# Patient Record
Sex: Female | Born: 1965
Health system: Southern US, Community
[De-identification: ages and names within clinical notes are randomized; demographics above are authoritative.]

## PROBLEM LIST (undated history)

## (undated) DIAGNOSIS — N200 Calculus of kidney: Secondary | ICD-10-CM

## (undated) DIAGNOSIS — A609 Anogenital herpesviral infection, unspecified: Secondary | ICD-10-CM

## (undated) DIAGNOSIS — A64 Unspecified sexually transmitted disease: Secondary | ICD-10-CM

## (undated) DIAGNOSIS — K589 Irritable bowel syndrome without diarrhea: Secondary | ICD-10-CM

## (undated) DIAGNOSIS — F32A Depression, unspecified: Secondary | ICD-10-CM

## (undated) DIAGNOSIS — I10 Essential (primary) hypertension: Secondary | ICD-10-CM

## (undated) DIAGNOSIS — R931 Abnormal findings on diagnostic imaging of heart and coronary circulation: Secondary | ICD-10-CM

## (undated) DIAGNOSIS — M26609 Unspecified temporomandibular joint disorder, unspecified side: Secondary | ICD-10-CM

## (undated) DIAGNOSIS — F329 Major depressive disorder, single episode, unspecified: Secondary | ICD-10-CM

## (undated) DIAGNOSIS — H409 Unspecified glaucoma: Secondary | ICD-10-CM

## (undated) DIAGNOSIS — N3010 Interstitial cystitis (chronic) without hematuria: Secondary | ICD-10-CM

## (undated) DIAGNOSIS — K219 Gastro-esophageal reflux disease without esophagitis: Secondary | ICD-10-CM

## (undated) DIAGNOSIS — S46009A Unspecified injury of muscle(s) and tendon(s) of the rotator cuff of unspecified shoulder, initial encounter: Secondary | ICD-10-CM

## (undated) DIAGNOSIS — IMO0002 Reserved for concepts with insufficient information to code with codable children: Secondary | ICD-10-CM

## (undated) DIAGNOSIS — Z8669 Personal history of other diseases of the nervous system and sense organs: Secondary | ICD-10-CM

## (undated) DIAGNOSIS — C44729 Squamous cell carcinoma of skin of left lower limb, including hip: Secondary | ICD-10-CM

## (undated) HISTORY — DX: Personal history of other diseases of the nervous system and sense organs: Z86.69

## (undated) HISTORY — DX: Anogenital herpesviral infection, unspecified: A60.9

## (undated) HISTORY — DX: Essential (primary) hypertension: I10

## (undated) HISTORY — DX: Unspecified sexually transmitted disease: A64

## (undated) HISTORY — DX: Irritable bowel syndrome, unspecified: K58.9

## (undated) HISTORY — DX: Unspecified injury of muscle(s) and tendon(s) of the rotator cuff of unspecified shoulder, initial encounter: S46.009A

## (undated) HISTORY — DX: Depression, unspecified: F32.A

## (undated) HISTORY — PX: SQUAMOUS CELL CARCINOMA EXCISION: SHX2433

## (undated) HISTORY — DX: Interstitial cystitis (chronic) without hematuria: N30.10

## (undated) HISTORY — DX: Gastro-esophageal reflux disease without esophagitis: K21.9

## (undated) HISTORY — DX: Calculus of kidney: N20.0

## (undated) HISTORY — DX: Squamous cell carcinoma of skin of left lower limb, including hip: C44.729

## (undated) HISTORY — PX: LITHOTRIPSY: SUR834

## (undated) HISTORY — DX: Unspecified glaucoma: H40.9

## (undated) HISTORY — DX: Abnormal findings on diagnostic imaging of heart and coronary circulation: R93.1

## (undated) HISTORY — DX: Reserved for concepts with insufficient information to code with codable children: IMO0002

## (undated) HISTORY — DX: Unspecified temporomandibular joint disorder, unspecified side: M26.609

## (undated) HISTORY — DX: Major depressive disorder, single episode, unspecified: F32.9

---
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
## 1996-02-16 HISTORY — PX: KNEE ARTHROPLASTY: SHX992

## 1999-10-17 DIAGNOSIS — Z8669 Personal history of other diseases of the nervous system and sense organs: Secondary | ICD-10-CM

## 1999-10-17 DIAGNOSIS — IMO0002 Reserved for concepts with insufficient information to code with codable children: Secondary | ICD-10-CM

## 1999-10-17 DIAGNOSIS — R87619 Unspecified abnormal cytological findings in specimens from cervix uteri: Secondary | ICD-10-CM

## 1999-10-17 HISTORY — DX: Reserved for concepts with insufficient information to code with codable children: IMO0002

## 1999-10-17 HISTORY — DX: Unspecified abnormal cytological findings in specimens from cervix uteri: R87.619

## 1999-10-17 HISTORY — DX: Personal history of other diseases of the nervous system and sense organs: Z86.69

## 1999-11-16 HISTORY — PX: COLPOSCOPY: SHX161

## 1999-11-23 ENCOUNTER — Other Ambulatory Visit: Admission: RE | Admit: 1999-11-23 | Discharge: 1999-11-23 | Payer: Self-pay | Admitting: Obstetrics and Gynecology

## 1999-11-23 ENCOUNTER — Encounter (INDEPENDENT_AMBULATORY_CARE_PROVIDER_SITE_OTHER): Payer: Self-pay

## 2000-03-21 ENCOUNTER — Other Ambulatory Visit: Admission: RE | Admit: 2000-03-21 | Discharge: 2000-03-21 | Payer: Self-pay | Admitting: Obstetrics and Gynecology

## 2000-04-11 ENCOUNTER — Other Ambulatory Visit: Admission: RE | Admit: 2000-04-11 | Discharge: 2000-04-11 | Payer: Self-pay | Admitting: Obstetrics and Gynecology

## 2000-06-28 LAB — HM PAP SMEAR

## 2000-07-14 ENCOUNTER — Other Ambulatory Visit: Admission: RE | Admit: 2000-07-14 | Discharge: 2000-07-14 | Payer: Self-pay | Admitting: Obstetrics and Gynecology

## 2000-10-20 ENCOUNTER — Other Ambulatory Visit: Admission: RE | Admit: 2000-10-20 | Discharge: 2000-10-20 | Payer: Self-pay | Admitting: Obstetrics and Gynecology

## 2001-01-17 ENCOUNTER — Other Ambulatory Visit: Admission: RE | Admit: 2001-01-17 | Discharge: 2001-01-17 | Payer: Self-pay | Admitting: Obstetrics and Gynecology

## 2001-05-04 ENCOUNTER — Other Ambulatory Visit: Admission: RE | Admit: 2001-05-04 | Discharge: 2001-05-04 | Payer: Self-pay | Admitting: Obstetrics and Gynecology

## 2001-11-09 ENCOUNTER — Other Ambulatory Visit: Admission: RE | Admit: 2001-11-09 | Discharge: 2001-11-09 | Payer: Self-pay | Admitting: Obstetrics and Gynecology

## 2002-02-15 DIAGNOSIS — M26609 Unspecified temporomandibular joint disorder, unspecified side: Secondary | ICD-10-CM

## 2002-02-15 HISTORY — DX: Unspecified temporomandibular joint disorder, unspecified side: M26.609

## 2002-04-16 DIAGNOSIS — IMO0002 Reserved for concepts with insufficient information to code with codable children: Secondary | ICD-10-CM

## 2002-04-16 HISTORY — DX: Reserved for concepts with insufficient information to code with codable children: IMO0002

## 2002-05-11 ENCOUNTER — Other Ambulatory Visit: Admission: RE | Admit: 2002-05-11 | Discharge: 2002-05-11 | Payer: Self-pay | Admitting: Obstetrics and Gynecology

## 2002-11-13 ENCOUNTER — Other Ambulatory Visit: Admission: RE | Admit: 2002-11-13 | Discharge: 2002-11-13 | Payer: Self-pay | Admitting: Obstetrics and Gynecology

## 2002-11-16 DIAGNOSIS — N3010 Interstitial cystitis (chronic) without hematuria: Secondary | ICD-10-CM

## 2002-11-16 DIAGNOSIS — K219 Gastro-esophageal reflux disease without esophagitis: Secondary | ICD-10-CM

## 2002-11-16 HISTORY — DX: Interstitial cystitis (chronic) without hematuria: N30.10

## 2002-11-16 HISTORY — DX: Gastro-esophageal reflux disease without esophagitis: K21.9

## 2003-07-25 ENCOUNTER — Other Ambulatory Visit: Admission: RE | Admit: 2003-07-25 | Discharge: 2003-07-25 | Payer: Self-pay | Admitting: Obstetrics and Gynecology

## 2004-02-13 ENCOUNTER — Other Ambulatory Visit: Admission: RE | Admit: 2004-02-13 | Discharge: 2004-02-13 | Payer: Self-pay | Admitting: Obstetrics and Gynecology

## 2004-07-27 ENCOUNTER — Ambulatory Visit: Payer: Self-pay | Admitting: Internal Medicine

## 2004-07-30 ENCOUNTER — Ambulatory Visit: Payer: Self-pay | Admitting: Internal Medicine

## 2004-08-19 ENCOUNTER — Encounter (INDEPENDENT_AMBULATORY_CARE_PROVIDER_SITE_OTHER): Payer: Self-pay | Admitting: *Deleted

## 2004-08-19 ENCOUNTER — Ambulatory Visit: Payer: Self-pay | Admitting: Internal Medicine

## 2005-02-01 ENCOUNTER — Other Ambulatory Visit: Admission: RE | Admit: 2005-02-01 | Discharge: 2005-02-01 | Payer: Self-pay | Admitting: Obstetrics and Gynecology

## 2005-02-23 ENCOUNTER — Other Ambulatory Visit: Admission: RE | Admit: 2005-02-23 | Discharge: 2005-02-23 | Payer: Self-pay | Admitting: Obstetrics and Gynecology

## 2006-02-15 HISTORY — PX: AUGMENTATION MAMMAPLASTY: SUR837

## 2006-03-25 ENCOUNTER — Other Ambulatory Visit: Admission: RE | Admit: 2006-03-25 | Discharge: 2006-03-25 | Payer: Self-pay | Admitting: Obstetrics & Gynecology

## 2007-04-11 ENCOUNTER — Other Ambulatory Visit: Admission: RE | Admit: 2007-04-11 | Discharge: 2007-04-11 | Payer: Self-pay | Admitting: Obstetrics and Gynecology

## 2008-04-12 ENCOUNTER — Other Ambulatory Visit: Admission: RE | Admit: 2008-04-12 | Discharge: 2008-04-12 | Payer: Self-pay | Admitting: Obstetrics and Gynecology

## 2008-05-29 IMAGING — CT CT HEAD W/O CM - CT MAXILLOFACIAL W/O CM
1 series · 14 of 16 positions shown, 18 images · non-contrast
Comparison: NONE

CLINICAL DATA: Four sinus infections within the last six months. 

CT OF THE PARANASAL SINUSES
TECHNIQUE: Multiple axial images were obtained.

[Series 2: — · axial · 0.29mm/px · z∈[+222,+315]mm · 14 of 16 slices shown, 18 images]
[im 2/16  brain]
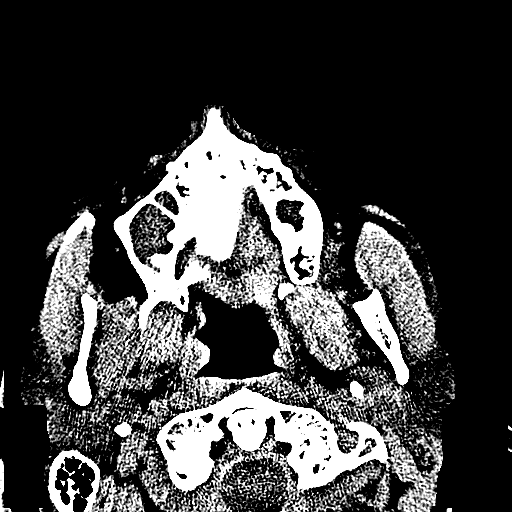
[im 2/16  bone]
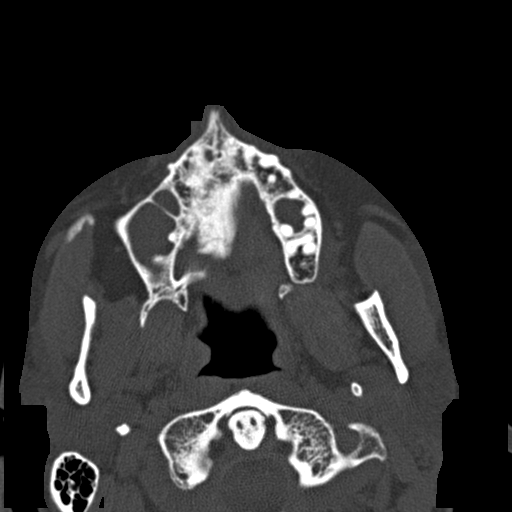
[im 3/16  bone]
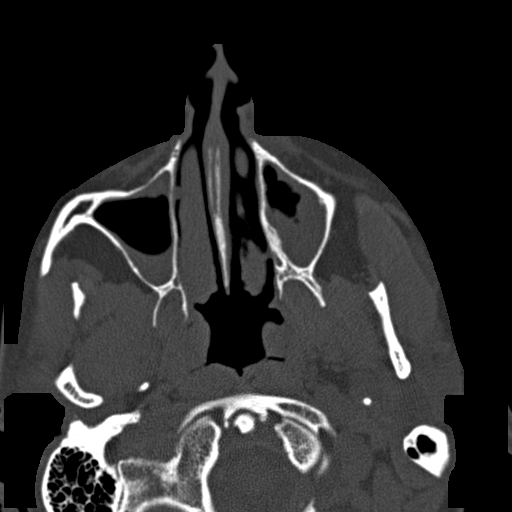
[im 4/16  bone]
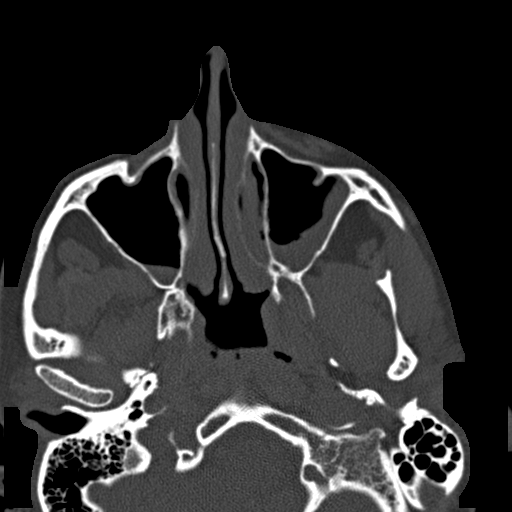
[im 5/16  bone]
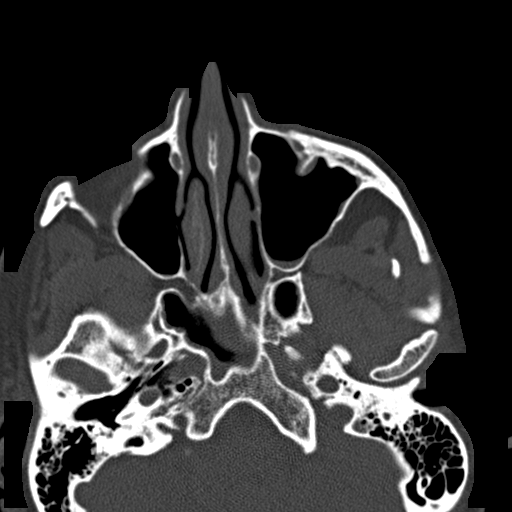
[im 6/16  brain]
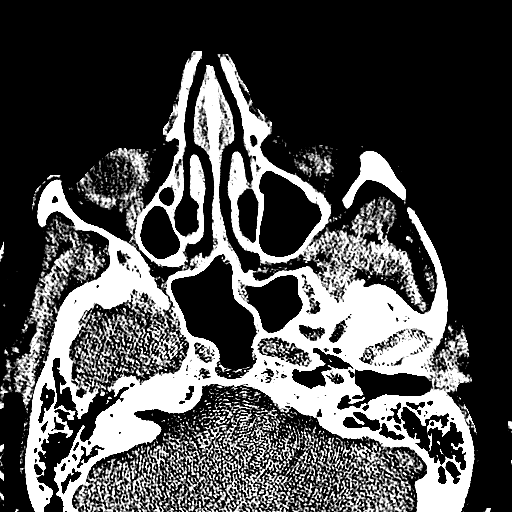
[im 6/16  bone]
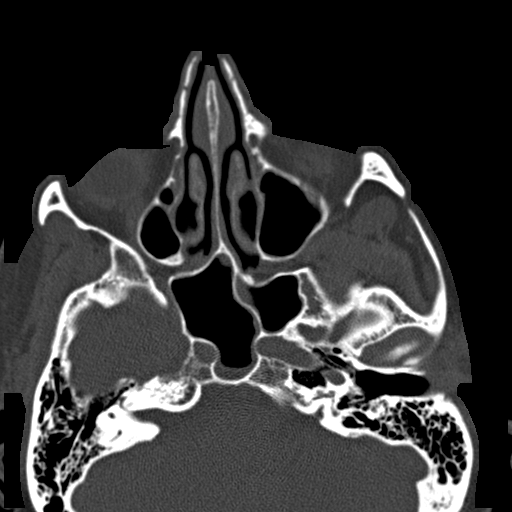
[im 7/16  bone]
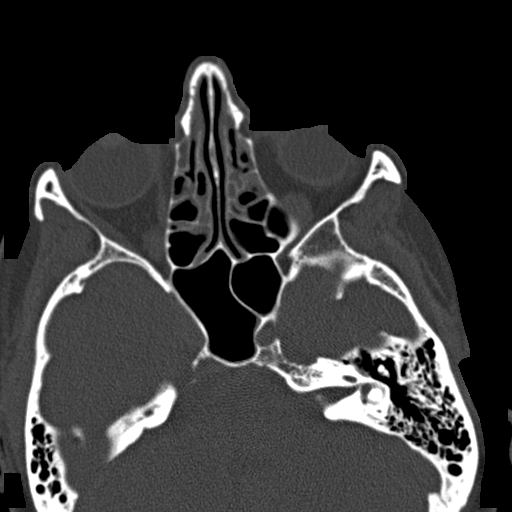
[im 8/16  bone]
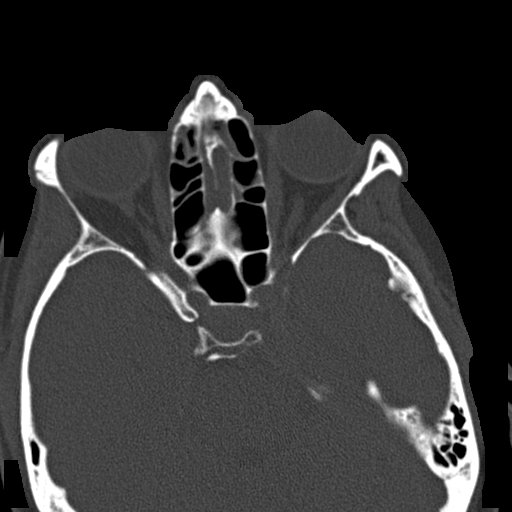
[im 9/16  bone]
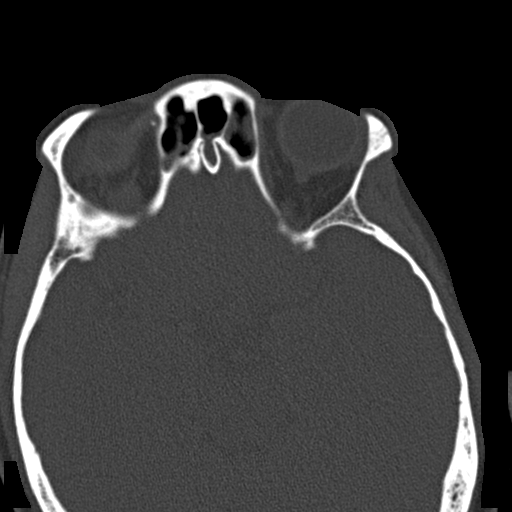
[im 10/16  brain]
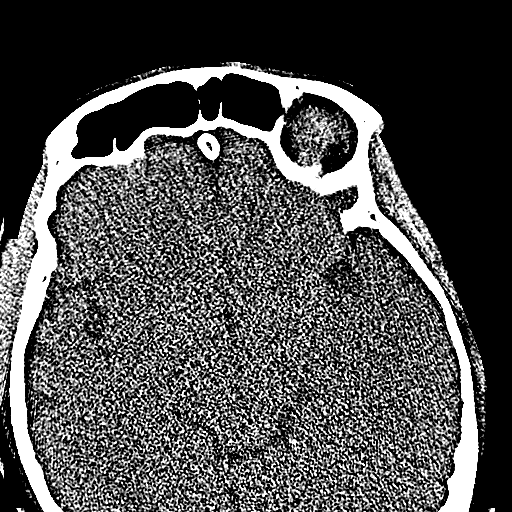
[im 10/16  bone]
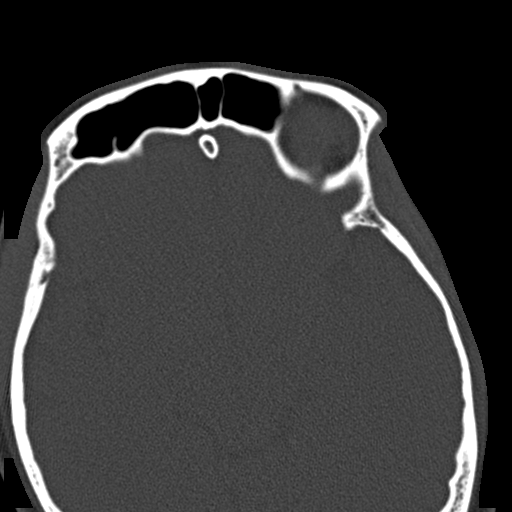
[im 11/16  bone]
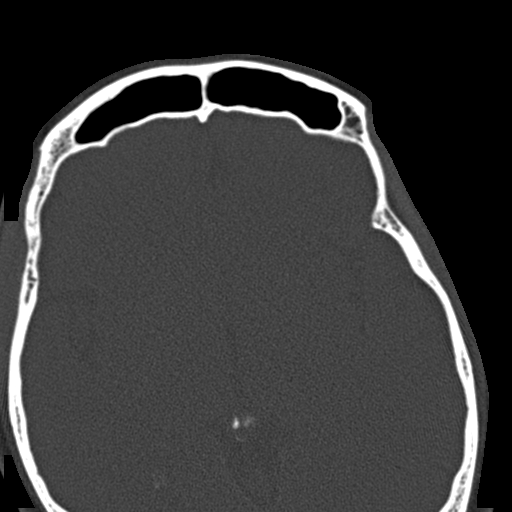
[im 12/16  bone]
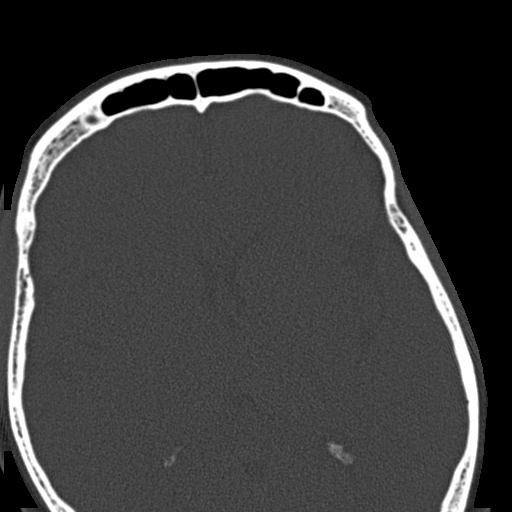
[im 13/16  bone]
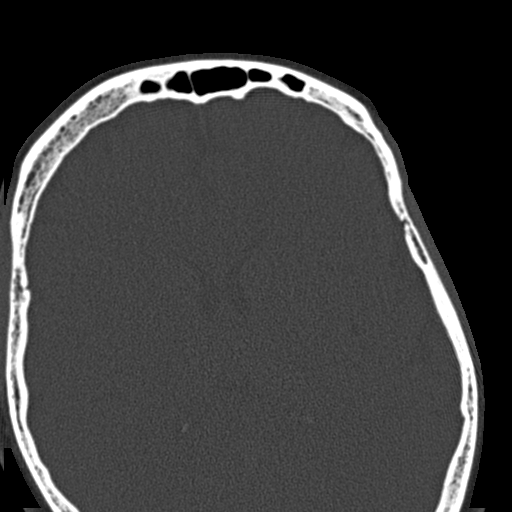
[im 14/16  brain]
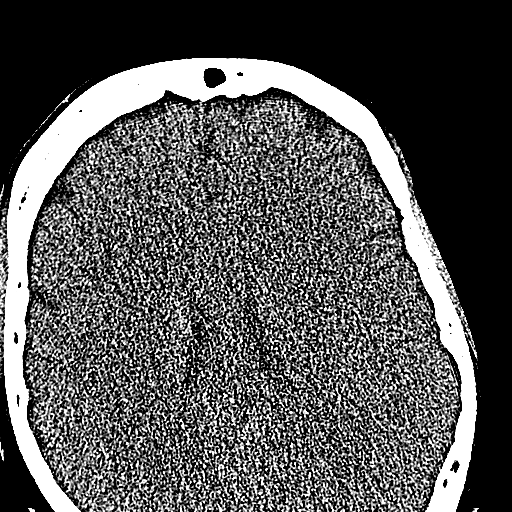
[im 14/16  bone]
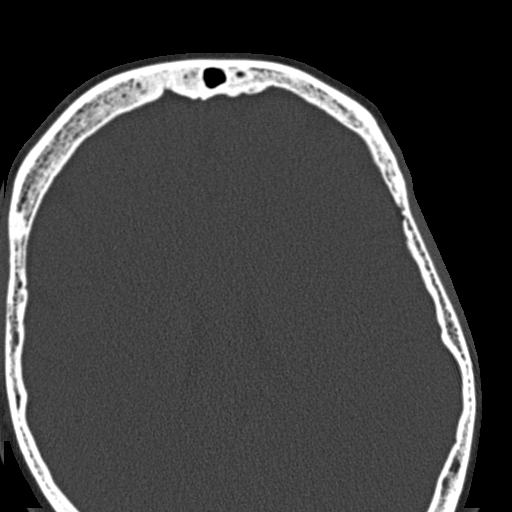
[im 15/16  bone]
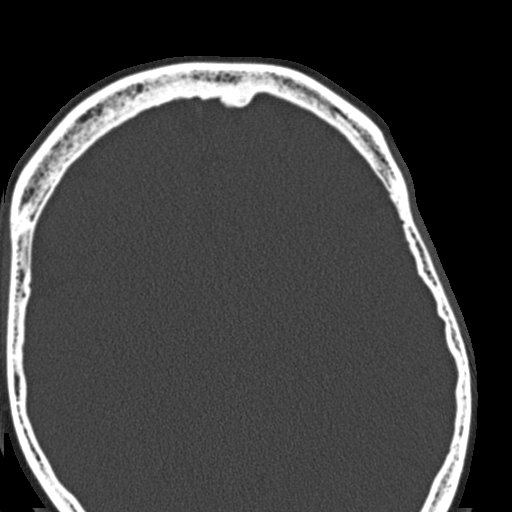

[14 of 16 positions shown; findings below may reference images not displayed]

FINDINGS: Mild to moderate chronic mucosal thickening is noted in 
the sphenoid sinus.  Mild to moderate mucosal thickening involves 
the ethmoid sinuses.  Frontal sinus appears clear except for 
minimal opacification at the frontoethmoidal recess bilaterally.  
There is chronic mucosal thickening in the maxillary sinuses, mild 
to moderate in extent.  Small air-fluid levels are noted in both 
maxillary sinuses.  Orbits and mastoids are unremarkable.  There 
is no evidence of nasopharyngeal mass.
IMPRESSION: Acute and chronic sinusitis as described above. [place]

## 2010-02-15 HISTORY — PX: FOOT SURGERY: SHX648

## 2012-05-17 ENCOUNTER — Encounter: Payer: Self-pay | Admitting: *Deleted

## 2012-05-19 ENCOUNTER — Encounter: Payer: Self-pay | Admitting: Nurse Practitioner

## 2012-05-29 ENCOUNTER — Encounter: Payer: Self-pay | Admitting: Nurse Practitioner

## 2012-05-29 ENCOUNTER — Encounter: Payer: Self-pay | Admitting: *Deleted

## 2012-05-30 ENCOUNTER — Encounter: Payer: Self-pay | Admitting: Nurse Practitioner

## 2012-05-30 ENCOUNTER — Ambulatory Visit (INDEPENDENT_AMBULATORY_CARE_PROVIDER_SITE_OTHER): Payer: BC Managed Care – PPO | Admitting: Nurse Practitioner

## 2012-05-30 VITALS — BP 112/76 | HR 68 | Resp 16 | Ht 64.0 in | Wt 168.0 lb

## 2012-05-30 DIAGNOSIS — Z0000 Encounter for general adult medical examination without abnormal findings: Secondary | ICD-10-CM

## 2012-05-30 DIAGNOSIS — Z01419 Encounter for gynecological examination (general) (routine) without abnormal findings: Secondary | ICD-10-CM

## 2012-05-30 LAB — POCT URINALYSIS DIPSTICK
Bilirubin, UA: NEGATIVE
Blood, UA: NEGATIVE
Glucose, UA: NEGATIVE
Nitrite, UA: NEGATIVE

## 2012-05-30 MED ORDER — NORETHIN ACE-ETH ESTRAD-FE 1-20 MG-MCG PO TABS: 1.0000 | ORAL_TABLET | Freq: Every day | ORAL | Status: DC

## 2012-05-30 NOTE — Progress Notes (Signed)
47 y.o. G3P1 Married Caucasian Fe here for annual exam.  On continuous active pills which has helped with PMS symptoms. No vaso symptoms.  Some decrease in libido. Sleep is good. She is still having some bowel function issues with constipation or diarrhea.  Sees GI in [place]. No LMP recorded. Patient is not currently having periods (Reason: Oral contraceptives).          Sexually active: yes  The current method of family planning is OCP (estrogen/progesterone).    Exercising: yes walking dog and dog training  Smoker:  no  Health Maintenance: Pap:  05/24/11 normal (do pap yearly for 20 yrs. from 2001) MMG: 01/04/12 normal Colonoscopy 12/2009 BMD:  n/a TDaP: 2012 Labs: Hgb 13.4, urine normal   reports that she has never smoked. She has never used smokeless tobacco. She reports that she drinks about 2.0 ounces of alcohol per week. She reports that she does not use illicit drugs.  Past Medical History  Diagnosis Date  . IBS (irritable bowel syndrome)   . Depression   . Abnormal pap 09/01    CIN1, II HR HPV  . H/O tension headache 09/01    family stressors  . Dyspareunia 03/04    PUS (neg)  . IC (interstitial cystitis) 11/2002    Dr. Logan Bores  . TMJ (temporomandibular joint syndrome) 2004  . GERD (gastroesophageal reflux disease) 10/04  . STD (sexually transmitted disease) 11/30/99, 12/06    HSV, 12/06  . Echocardiogram abnormal about 1998    physiological mitral & tricuspid regurge, but no MVP, No SBE prophylaxis  . HSV (herpes simplex virus) anogenital infection hx    Past Surgical History  Procedure Laterality Date  . Vagina surgery  1982    rectal/vaginal repair post partum  . Knee arthroplasty  1998    left knee reconstruction  . Colposcopy  10/01    no lesion  . Augmentation mammaplasty  01/08    reconstruction  . Foot surgery  2012    right     Current Outpatient Prescriptions  Medication Sig Dispense Refill  . calcium carbonate (OS-CAL) 600 MG TABS Take 600 mg by  mouth 2 (two) times daily with a meal.      . cetirizine (ZYRTEC) 10 MG tablet Take 10 mg by mouth daily.      . Cholecalciferol (VITAMIN D) 2000 UNITS tablet Take 2,000 Units by mouth daily.      Marland Kitchen Hyoscyamine Sulfate (LEVBID PO) Take by mouth.      . latanoprost (XALATAN) 0.005 % ophthalmic solution 1 drop at bedtime.      . norethindrone-ethinyl estradiol (JUNEL FE,GILDESS FE,LOESTRIN FE) 1-20 MG-MCG tablet Take 1 tablet by mouth daily.      Marland Kitchen omeprazole (PRILOSEC) 20 MG capsule Take 20 mg by mouth daily.      . S-Adenosylmethionine (SAM-E PO) Take by mouth.      . valACYclovir (VALTREX) 500 MG tablet Take 500 mg by mouth 2 (two) times daily.      . Wheat Dextrin (BENEFIBER) POWD Take by mouth.       No current facility-administered medications for this visit.    Family History  Problem Relation Age of Onset  . Cervical cancer Mother   . Scoliosis Mother   . Cervical cancer Maternal Grandmother     ROS:  Pertinent items are noted in HPI.  Otherwise, a comprehensive ROS was negative.  Exam:   BP 112/76  Pulse 68  Resp 16  Ht 5\' 4"  (  1.626 m)  Wt 168 lb (76.204 kg)  BMI 28.82 kg/m2   Height: 5\' 4"  (162.6 cm)  Ht Readings from Last 3 Encounters:  05/30/12 5\' 4"  (1.626 m)    General appearance: alert, cooperative and appears stated age Head: Normocephalic, without obvious abnormality, atraumatic Neck: no adenopathy, supple, symmetrical, trachea midline and thyroid normal to inspection and palpation Lungs: clear to auscultation bilaterally Breasts: normal appearance, no masses or tenderness. Breast implants without problems Heart: regular rate and rhythm Abdomen: soft, non-tender; no masses,  no organomegaly Extremities: extremities normal, atraumatic, no cyanosis or edema Skin: Skin color, texture, turgor normal. No rashes or lesions Lymph nodes: Cervical, supraclavicular, and axillary nodes normal. No abnormal inguinal nodes palpated Neurologic: Grossly normal   Pelvic:  External genitalia:  no lesions              Urethra:  normal appearing urethra with no masses, tenderness or lesions              Bartholin's and Skene's: normal                 Vagina: normal appearing vagina with normal color and discharge, no lesions.  Mild rectocele present.              Cervix: anteverted              Pap taken: yes also added HR HPV Bimanual Exam:  Uterus:  normal size, contour, position, consistency, mobility, non-tender              Adnexa: no mass, fullness, tenderness               Rectovaginal: Confirms               Anus:  normal sphincter tone, no lesions, rectocele present  A:  Well Woman with normal exam  No contraindications for continued oral  contraception therapy continuous dosing  History of CIN II 2001  S/P bilateral breast implants    P:   Mammogram due 12/2012  pap smear yearly per guidelines  counseled on breast self exam, adequate intake of  calcium and vitamin D, diet and exercise, Kegel's  Exercises  Rf OCP for a year  return annually or prn    IFOB given to patient  An After Visit Summary was printed and given to the patient.

## 2012-05-30 NOTE — Patient Instructions (Addendum)
EXERCISE AND DIET:  We recommended that you start or continue a regular exercise program for good health. Regular exercise means any activity that makes your heart beat faster and makes you sweat.  We recommend exercising at least 30 minutes per day at least 3 days a week, preferably 4 or 5.  We also recommend a diet low in fat and sugar.  Inactivity, poor dietary choices and obesity can cause diabetes, heart attack, stroke, and kidney damage, among others.    ALCOHOL AND SMOKING:  Women should limit their alcohol intake to no more than 7 drinks/beers/glasses of wine (combined, not each!) per week. Moderation of alcohol intake to this level decreases your risk of breast cancer and liver damage. And of course, no recreational drugs are part of a healthy lifestyle.  And absolutely no smoking or even second hand smoke. Most people know smoking can cause heart and lung diseases, but did you know it also contributes to weakening of your bones? Aging of your skin?  Yellowing of your teeth and nails?  CALCIUM AND VITAMIN D:  Adequate intake of calcium and Vitamin D are recommended.  The recommendations for exact amounts of these supplements seem to change often, but generally speaking 600 mg of calcium (either carbonate or citrate) and 800 units of Vitamin D per day seems prudent. Certain women may benefit from higher intake of Vitamin D.  If you are among these women, your doctor will have told you during your visit.    PAP SMEARS:  Pap smears, to check for cervical cancer or precancers,  have traditionally been done yearly, although recent scientific advances have shown that most women can have pap smears less often.  However, every woman still should have a physical exam from her gynecologist every year. It will include a breast check, inspection of the vulva and vagina to check for abnormal growths or skin changes, a visual exam of the cervix, and then an exam to evaluate the size and shape of the uterus and  ovaries.  And after 49 years of age, a rectal exam is indicated to check for rectal cancers. We will also provide age appropriate advice regarding health maintenance, like when you should have certain vaccines, screening for sexually transmitted diseases, bone density testing, colonoscopy, mammograms, etc.   MAMMOGRAMS:  All women over 40 years old should have a yearly mammogram. Many facilities now offer a "3D" mammogram, which may cost around $50 extra out of pocket. If possible,  we recommend you accept the option to have the 3D mammogram performed.  It both reduces the number of women who will be called back for extra views which then turn out to be normal, and it is better than the routine mammogram at detecting truly abnormal areas.    COLONOSCOPY:  Colonoscopy to screen for colon cancer is recommended for all women at age 50.  We know, you hate the idea of the prep.  We agree, BUT, having colon cancer and not knowing it is worse!!  Colon cancer so often starts as a polyp that can be seen and removed at colonscopy, which can quite literally save your life!  And if your first colonoscopy is normal and you have no family history of colon cancer, most women don't have to have it again for 10 years.  Once every ten years, you can do something that may end up saving your life, right?  We will be happy to help you get it scheduled when you are ready.    Be sure to check your insurance coverage so you understand how much it will cost.  It may be covered as a preventative service at no cost, but you should check your particular policy.    Rectocele/Enterocele Care After A woman's birth canal (vagina) can become weak or stretched. This can be caused by childbirth, heavy lifting, lasting (chronic) constipation, aging, or pelvic surgery. When the vagina is weak and stretched, parts of the intestine can bulge into the vagina by pushing against the vaginal walls. A rectocele is when the very end of the large  intestine (rectum) causes the bulge. An enterocele is when the small intestine causes the bulge. Surgery to fix this problem is usually done through the vagina. If you just had this surgery, you were probably given a drug to make you sleep (general anesthetic) or a drug that numbs you from the waist down (spinal/epidural). Here is what happened:  The small intestine or rectum was pushed back to its normal place.  The vaginal wall was made stronger. Sometimes this is done with stitches or a mesh-like material. HOME CARE INSTRUCTIONS  Some women go home the same day as their surgery. Others stay in the hospital for a few days. This depends on the size and type of repair.  Pain and Medications  Some pain is normal after this surgery. Only take pain medicine your surgeon prescribed. Follow the directions carefully.  Do not take aspirin. It can cause bleeding.  Do not drink alcohol while taking pain medication.  You may be given a medicine (antibiotic) that kills germs. Follow the directions carefully.  Take warm sitz baths 2 times a day to control discomfort and reduce any swelling. Take sitz baths with your caregiver's permission. Diet  Go back to your normal eating as directed by your caregiver.  Drink a lot of fluids. Drink at least 6 glasses of water every day. Activity  Move around and walk as much as possible. This can keep blood clots from forming in your legs.  Do not climb stairs until your caregiver says it is okay.  Do not lift objects 5 pounds (2.3 kg) or heavier. Do not bend or strain for 6 to 8 weeks.  Do not drive until after you stop taking pain medicine and your caregiver says it is okay.  Your return to work will depend on the type of work you do. Ask your caregiver what is best for you.  Ask your caregiver when you can resume sexual activity. Most women can start having sex in about 6 weeks after their surgery.  Get plenty of rest during the day and sleep at  night.  Have someone help you with your household chores and activities for 3 to 4 weeks. Other Precautions  You may have some discharge from the vagina for a few weeks after the surgery. It may have small amounts of blood in it. This is normal. If you have questions, ask your caregiver.  Do not use tampons or douche.  You should be able to take a shower a day after your surgery. Do not take a tub bath for at least a week.  Take it easy for awhile. You should feel much better in 2 to 3 weeks. It may take up to 6 weeks to feel completely normal.  Keep all follow-up appointments.  Take your temperature twice a day and write it down.  Make sure your family understands everything about your surgery and recovery. SEEK MEDICAL CARE IF:  You have any questions about your medication, or you need stronger pain medication.  Pain continues, even after taking pain medication.  You become constipated.  You have an oral temperature above 102 F (38.9 C).  You develop swelling and redness in the surgery area.  You become dizzy or lightheaded.  You feel sick to your stomach (nauseous), throw up (vomit), or have diarrhea.  You develop a rash.  You have a reaction to your medications. SEEK IMMEDIATE MEDICAL CARE IF:   Pain gets worse.  You have new bleeding from your vagina.  Discharge from the vagina becomes heavy, or it has a bad smell.  You have an oral temperature above 102 F (38.9 C), not controlled by medicine.  You develop belly (abdominal) pain.  You develop chest pain.  You develop shortness of breath.  You pass out (faint).  You develop pain, swelling, or redness in the leg.  You have pain or burning with urination.  You have bloody urine or cannot urinate. MAKE SURE YOU:   Understand these instructions.  Will watch your condition.  Will get help right away if you are not doing well or get worse. Document Released: 04/28/2009 Document Revised: 04/26/2011  Document Reviewed: 04/28/2009 [facility] Patient Information 2013 [place], [place].

## 2012-06-01 LAB — IPS PAP TEST WITH HPV

## 2012-06-01 LAB — HEMOGLOBIN, FINGERSTICK: Hemoglobin, fingerstick: 13.4 g/dL (ref 12.0–16.0)

## 2012-06-01 NOTE — Progress Notes (Signed)
Encounter reviewed by Dr. Demetra Moya Silva.  

## 2013-01-24 ENCOUNTER — Encounter: Payer: Self-pay | Admitting: Internal Medicine

## 2013-01-29 ENCOUNTER — Other Ambulatory Visit: Payer: Self-pay | Admitting: Nurse Practitioner

## 2013-05-30 ENCOUNTER — Ambulatory Visit: Payer: BC Managed Care – PPO | Admitting: Nurse Practitioner

## 2013-05-31 ENCOUNTER — Encounter: Payer: Self-pay | Admitting: Nurse Practitioner

## 2013-05-31 ENCOUNTER — Ambulatory Visit (INDEPENDENT_AMBULATORY_CARE_PROVIDER_SITE_OTHER): Payer: BC Managed Care – PPO | Admitting: Nurse Practitioner

## 2013-05-31 VITALS — BP 134/90 | HR 60 | Ht 64.0 in | Wt 160.0 lb

## 2013-05-31 DIAGNOSIS — Z01419 Encounter for gynecological examination (general) (routine) without abnormal findings: Secondary | ICD-10-CM

## 2013-05-31 DIAGNOSIS — Z79899 Other long term (current) drug therapy: Secondary | ICD-10-CM

## 2013-05-31 DIAGNOSIS — Z0000 Encounter for general adult medical examination without abnormal findings: Secondary | ICD-10-CM

## 2013-05-31 LAB — LIPID PANEL
Cholesterol: 197 mg/dL (ref 0–200)
HDL: 51 mg/dL (ref >39)
LDL CALC: 104 mg/dL — AB (ref 0–99)
TRIGLYCERIDES: 211 mg/dL — AB (ref <150)
Total CHOL/HDL Ratio: 3.9 Ratio
VLDL: 42 mg/dL — ABNORMAL HIGH (ref 0–40)

## 2013-05-31 LAB — POCT URINALYSIS DIPSTICK
BILIRUBIN UA: NEGATIVE
Glucose, UA: NEGATIVE
KETONES UA: NEGATIVE
LEUKOCYTES UA: NEGATIVE
Nitrite, UA: NEGATIVE
PH UA: 5
Protein, UA: NEGATIVE
Urobilinogen, UA: NEGATIVE

## 2013-05-31 LAB — COMPREHENSIVE METABOLIC PANEL
ALBUMIN: 4.3 g/dL (ref 3.5–5.2)
ALK PHOS: 54 U/L (ref 39–117)
ALT: 23 U/L (ref 0–35)
AST: 17 U/L (ref 0–37)
BUN: 16 mg/dL (ref 6–23)
CO2: 26 mEq/L (ref 19–32)
Calcium: 9.6 mg/dL (ref 8.4–10.5)
Chloride: 104 mEq/L (ref 96–112)
Creat: 0.8 mg/dL (ref 0.50–1.10)
Glucose, Bld: 92 mg/dL (ref 70–99)
POTASSIUM: 4.5 meq/L (ref 3.5–5.3)
SODIUM: 138 meq/L (ref 135–145)
Total Bilirubin: 1.2 mg/dL (ref 0.2–1.2)
Total Protein: 7.2 g/dL (ref 6.0–8.3)

## 2013-05-31 LAB — HEMOGLOBIN, FINGERSTICK: Hemoglobin, fingerstick: 13.3 g/dL (ref 12.0–16.0)

## 2013-05-31 LAB — TSH: TSH: 2.216 u[IU]/mL (ref 0.350–4.500)

## 2013-05-31 MED ORDER — ETONOGESTREL-ETHINYL ESTRADIOL 0.12-0.015 MG/24HR VA RING: VAGINAL_RING | VAGINAL | Status: DC

## 2013-05-31 MED ORDER — VALACYCLOVIR HCL 500 MG PO TABS: ORAL_TABLET | ORAL | Status: DC

## 2013-05-31 NOTE — Progress Notes (Signed)
Patient ID: Gabriela Figueroa, female   DOB: 01/25/1966, 48 y.o.   MRN: [id number] 48 y.o. G17P1 Married Caucasian Fe here for annual exam.  Went off OCP last summer secondary to melasma on her face and neck.  She has a menses on her own in June, prior to that no menses since 02/10/2009 on continuous active pills.  Wants to try Nuva ring this summer to reduce potential risk of melasma.  She is aware that she needs to wear sun protection and that she still could get melasma.  They have bought a home and redoing in [facility] area.  Patient's last menstrual period was 07/30/2012.          Sexually active: yes  The current method of family planning is OCP (estrogen/progesterone).    Exercising: yes  walking and hiking 30 minutes everyday weather permitting. Smoker:  no  Health Maintenance: Pap:  05/30/12, WNL, neg HR HPV (do pap yearly for 20 yrs. from 2001) MMG: 01/04/12 normal Colonoscopy 12/2009 (yearly IFOB) BMD:  n/a TDaP: 2012 Labs:  HB:  13.3 Urine:  Small RBC    reports that she has never smoked. She has never used smokeless tobacco. She reports that she drinks about 2 ounces of alcohol per week. She reports that she does not use illicit drugs.  Past Medical History  Diagnosis Date  . IBS (irritable bowel syndrome)   . Depression   . Abnormal pap 09/01    CIN1, II HR HPV  . H/O tension headache 09/01    family stressors  . Dyspareunia 03/04    PUS (neg)  . IC (interstitial cystitis) 11/2002    Dr. Amalia Hailey  . TMJ (temporomandibular joint syndrome) 2004  . GERD (gastroesophageal reflux disease) 10/04  . STD (sexually transmitted disease) 11/30/99, 12/06    HSV, 12/06  . Echocardiogram abnormal about 1998    physiological mitral & tricuspid regurge, but no MVP, No SBE prophylaxis  . HSV (herpes simplex virus) anogenital infection hx    Past Surgical History  Procedure Laterality Date  . Vagina surgery  1982    rectal/vaginal repair post partum  . Knee arthroplasty  1998     left knee reconstruction  . Colposcopy  10/01    no lesion  . Augmentation mammaplasty  01/08    reconstruction  . Foot surgery  2012    right     Current Outpatient Prescriptions  Medication Sig Dispense Refill  . calcium carbonate (OS-CAL) 600 MG TABS Take 600 mg by mouth 2 (two) times daily with a meal.      . Hyoscyamine Sulfate (LEVBID PO) Take by mouth.      . latanoprost (XALATAN) 0.005 % ophthalmic solution Place 1 drop into both eyes at bedtime.       . norethindrone-ethinyl estradiol (JUNEL FE,GILDESS FE,LOESTRIN FE) 1-20 MG-MCG tablet Take 1 tablet by mouth daily. Take continuous active pills daily  4 Package  3  . omeprazole (PRILOSEC) 20 MG capsule Take 20 mg by mouth daily.      . S-Adenosylmethionine (SAM-E PO) Take 200 mg by mouth daily.       . valACYclovir (VALTREX) 500 MG tablet TAKE 1 TABLET BY MOUTH TWICE A DAY  60 tablet  6  . Wheat Dextrin (BENEFIBER) POWD Take by mouth as needed.       . cetirizine (ZYRTEC) 10 MG tablet Take 10 mg by mouth daily.      Marland Kitchen etonogestrel-ethinyl estradiol (NUVARING) 0.12-0.015 MG/24HR vaginal ring  Insert vaginally and use ring continuously  - 3 months = 4 rings  4 each  3   No current facility-administered medications for this visit.    Family History  Problem Relation Age of Onset  . Cervical cancer Mother   . Scoliosis Mother   . Diabetes Mother   . Cervical cancer Maternal Grandmother   . Lung cancer Maternal Grandfather     ROS:  Pertinent items are noted in HPI.  Otherwise, a comprehensive ROS was negative.  Exam:   BP 134/90  Pulse 60  Ht 5\' 4"  (1.626 m)  Wt 160 lb (72.576 kg)  BMI 27.45 kg/m2  LMP 07/30/2012 Height: 5\' 4"  (162.6 cm)  Ht Readings from Last 3 Encounters:  05/31/13 5\' 4"  (1.626 m)  05/30/12 5\' 4"  (1.626 m)    General appearance: alert, cooperative and appears stated age Head: Normocephalic, without obvious abnormality, atraumatic Neck: no adenopathy, supple, symmetrical, trachea midline and  thyroid normal to inspection and palpation Lungs: clear to auscultation bilaterally Breasts: normal appearance, no masses or tenderness, positive findings: implants bilaterally Heart: regular rate and rhythm Abdomen: soft, non-tender; no masses,  no organomegaly Extremities: extremities normal, atraumatic, no cyanosis or edema Skin: Skin color, texture, turgor normal. No rashes or lesions Lymph nodes: Cervical, supraclavicular, and axillary nodes normal. No abnormal inguinal nodes palpated Neurologic: Grossly normal   Pelvic: External genitalia:  no lesions              Urethra:  normal appearing urethra with no masses, tenderness or lesions              Bartholin's and Skene's: normal                 Vagina: normal appearing vagina with normal color and discharge, no lesions              Cervix: anteverted              Pap taken: yes Bimanual Exam:  Uterus:  normal size, contour, position, consistency, mobility, non-tender              Adnexa: no mass, fullness, tenderness               Rectovaginal: Confirms               Anus:  normal sphincter tone, no lesions  A:  Well Woman with normal exam  OCP for cycle regulation  History of Melasma - wants to try Nuva ring this summer  history of CIN II 10/1999  History of Vit D deficiency    P:   Pap smear as per guidelines Needs pap X 20 years from 2001  Mammogram is due now and will schedule  Follow with labs and urine culture  IFOB not given this year as she is scheduling her repeat colonoscopy  Order placed for BMD - GI felt she should get this done because of being on PPI for 20 years.  Order placed for Nuva ring for a year - she may want to switch back to OCP later. She is aware that melasma may still occur with the Nuva Ring. Counseled with potential risk and SE.  Counseled on breast self exam, mammography screening, use and side effects of OCP's, adequate intake of calcium and vitamin D, diet and exercise return annually or  prn  An After Visit Summary was printed and given to the patient.

## 2013-05-31 NOTE — Patient Instructions (Signed)

## 2013-06-01 LAB — VITAMIN D 25 HYDROXY (VIT D DEFICIENCY, FRACTURES): Vit D, 25-Hydroxy: 50 ng/mL (ref 30–89)

## 2013-06-02 LAB — URINE CULTURE
Colony Count: NO GROWTH
Organism ID, Bacteria: NO GROWTH

## 2013-06-04 LAB — IPS PAP TEST WITH HPV

## 2013-06-04 NOTE — Progress Notes (Signed)
Encounter reviewed by Dr. Brook Silva.  

## 2013-06-05 ENCOUNTER — Telehealth: Payer: Self-pay | Admitting: Nurse Practitioner

## 2013-06-05 NOTE — Telephone Encounter (Signed)
Patient states she is calling amy back from yesterday about results i do not see a note in here.

## 2013-06-05 NOTE — Telephone Encounter (Signed)
Spoke with patient. Message from Gabriela Figueroa, [place] given as seen below. Patient is agreeable and verbalizes understanding stating "I will try to eat healthier where I can and make some changes." Advised to call back with any further needs, questions, or concerns. Patient agreeable.   Notes Recorded by Oleta Mouse, RN on 06/04/2013 at [place] for results. ------  Notes Recorded by Gabriela Cage, FNP on 06/04/2013 at 12:36 PM pap08 ------  Notes Recorded by Gabriela Cage, FNP on 06/03/2013 at 6:31 PM Let patient know that urine culture was negative. Her other labs of lipid panel shows an elevated triglyceride level which can be a warning for DM later. This can come down with diet. TSH, Vit D, and CMP are all normal. Blood sugar on CMP was 92.  Routing to provider for final review. Patient agreeable to disposition. Will close encounter

## 2013-07-18 ENCOUNTER — Encounter: Payer: Self-pay | Admitting: Internal Medicine

## 2013-12-17 ENCOUNTER — Encounter: Payer: Self-pay | Admitting: Nurse Practitioner

## 2014-01-07 ENCOUNTER — Telehealth: Payer: Self-pay | Admitting: Nurse Practitioner

## 2014-01-07 NOTE — Telephone Encounter (Signed)
Spoke with patient. Patient states that she started on the nuvaring back in April due to "I was having these brown spots under my chin. I was also having night sweats and hot flashes so we thought this might help. I am noticing that every time I change my Nuvaring the time frame from placing a new one to me having spotting is decreasing. I start spotting now around the second week of having a new ring it. It also hasn't made much of a different with my hot flashes and night sweats." Patient would like to switch back to Junel Fe until next annual. Advised patient would send a message over to Milford Cage, [place] regarding request and return call with further recommendations and suggestions. Patient is agreeable.  Milford Cage, FNP okay to place order for Junel Fe at this time?

## 2014-01-07 NOTE — Telephone Encounter (Signed)
Patient calling to report spotting on Nuvaring and she wants to go back to Junel FE.  [id number] - [place] [place], [place] [address]

## 2014-01-07 NOTE — Telephone Encounter (Signed)
Yes OK to go back on Junel Fe 1/20 after this ring until next AEX.

## 2014-01-08 MED ORDER — NORETHIN ACE-ETH ESTRAD-FE 1-20 MG-MCG PO TABS: 1.0000 | ORAL_TABLET | Freq: Every day | ORAL | Status: DC

## 2014-01-08 NOTE — Telephone Encounter (Signed)
Spoke with patient. Advised patient of message as seen below from Milford Cage, [place]. Patient is agreeable and verbalizes understanding. Prescription for Junel Fe 1/20 #4 1RF as patient takes continuous sent to pharmacy on file. Patient is agreeable.  Routing to provider for final review. Patient agreeable to disposition. Will close encounter

## 2014-06-06 ENCOUNTER — Encounter: Payer: Self-pay | Admitting: Nurse Practitioner

## 2014-06-06 ENCOUNTER — Ambulatory Visit (INDEPENDENT_AMBULATORY_CARE_PROVIDER_SITE_OTHER): Payer: BLUE CROSS/BLUE SHIELD | Admitting: Nurse Practitioner

## 2014-06-06 VITALS — BP 100/60 | HR 60 | Ht 64.0 in | Wt 162.0 lb

## 2014-06-06 DIAGNOSIS — Z01419 Encounter for gynecological examination (general) (routine) without abnormal findings: Secondary | ICD-10-CM

## 2014-06-06 DIAGNOSIS — Z0000 Encounter for general adult medical examination without abnormal findings: Secondary | ICD-10-CM

## 2014-06-06 DIAGNOSIS — R300 Dysuria: Secondary | ICD-10-CM | POA: Diagnosis not present

## 2014-06-06 DIAGNOSIS — R319 Hematuria, unspecified: Secondary | ICD-10-CM | POA: Diagnosis not present

## 2014-06-06 LAB — CBC
HCT: 40.4 % (ref 36.0–46.0)
Hemoglobin: 13.4 g/dL (ref 12.0–15.0)
MCH: 30.9 pg (ref 26.0–34.0)
MCHC: 33.2 g/dL (ref 30.0–36.0)
MCV: 93.3 fL (ref 78.0–100.0)
MPV: 9.1 fL (ref 8.6–12.4)
PLATELETS: 286 10*3/uL (ref 150–400)
RBC: 4.33 MIL/uL (ref 3.87–5.11)
RDW: 12.4 % (ref 11.5–15.5)
WBC: 5.3 10*3/uL (ref 4.0–10.5)

## 2014-06-06 LAB — LIPID PANEL
Cholesterol: 190 mg/dL (ref 0–200)
HDL: 49 mg/dL (ref >=46)
LDL Cholesterol: 109 mg/dL — ABNORMAL HIGH (ref 0–99)
Total CHOL/HDL Ratio: 3.9 Ratio
Triglycerides: 160 mg/dL — ABNORMAL HIGH (ref <150)
VLDL: 32 mg/dL (ref 0–40)

## 2014-06-06 LAB — POCT URINALYSIS DIPSTICK
Bilirubin, UA: NEGATIVE
Glucose, UA: NEGATIVE
Ketones, UA: NEGATIVE
Leukocytes, UA: NEGATIVE
Nitrite, UA: NEGATIVE
Protein, UA: NEGATIVE
Urobilinogen, UA: NEGATIVE
pH, UA: 5

## 2014-06-06 LAB — COMPREHENSIVE METABOLIC PANEL
ALK PHOS: 51 U/L (ref 39–117)
ALT: 40 U/L — ABNORMAL HIGH (ref 0–35)
AST: 25 U/L (ref 0–37)
Albumin: 4.2 g/dL (ref 3.5–5.2)
BILIRUBIN TOTAL: 1 mg/dL (ref 0.2–1.2)
BUN: 13 mg/dL (ref 6–23)
CO2: 23 mEq/L (ref 19–32)
Calcium: 9.5 mg/dL (ref 8.4–10.5)
Chloride: 105 mEq/L (ref 96–112)
Creat: 0.77 mg/dL (ref 0.50–1.10)
Glucose, Bld: 82 mg/dL (ref 70–99)
POTASSIUM: 4.4 meq/L (ref 3.5–5.3)
Sodium: 140 mEq/L (ref 135–145)
Total Protein: 7.1 g/dL (ref 6.0–8.3)

## 2014-06-06 LAB — TSH: TSH: 2.187 u[IU]/mL (ref 0.350–4.500)

## 2014-06-06 MED ORDER — NORETHIN ACE-ETH ESTRAD-FE 1-20 MG-MCG PO TABS: 1.0000 | ORAL_TABLET | Freq: Every day | ORAL | Status: DC

## 2014-06-06 MED ORDER — VALACYCLOVIR HCL 500 MG PO TABS: ORAL_TABLET | ORAL | Status: DC

## 2014-06-06 NOTE — Progress Notes (Signed)
Patient ID: Gabriela Figueroa, female   DOB: 1965-10-09, 49 y.o.   MRN: [id number] 49 y.o. G3P1 Married  Caucasian Fe here for annual exam.  Back on OCP for about 4 months.  Did not like Nuva Ring and very little help with melasma.  Now on continuous Junel Fe with amenorrhea.  Her step son was involved in a major MVA last year and has had to move in wiht them for care.  They are trying to get disability for him.  Patient's last menstrual period was 12/05/2013 (approximate).          Sexually active: Yes.    The current method of family planning is OCP (estrogen/progesterone).    Exercising: Yes.    walking and yardwork Smoker:  no  Health Maintenance: Pap:  05/31/13, negative with neg HR HPV  (do pap year for 20 yrs from 2001) MMG:  09/21/13, Bi-Rads 1:  Negative ([facility]) Colonoscopy:  12/2009 (yearly IFOB) will see GI in 2 weeks  TDaP:  2012 Labs:  HB:  13.3  Urine:  Trace RBC    reports that she has never smoked. She has never used smokeless tobacco. She reports that she drinks about 2.0 oz of alcohol per week. She reports that she does not use illicit drugs.  Past Medical History  Diagnosis Date  . IBS (irritable bowel syndrome)   . Depression   . Abnormal pap 09/01    CIN1, II HR HPV  . H/O tension headache 09/01    family stressors  . Dyspareunia 03/04    PUS (neg)  . IC (interstitial cystitis) 11/2002    Dr. Amalia Hailey  . TMJ (temporomandibular joint syndrome) 2004  . GERD (gastroesophageal reflux disease) 10/04  . STD (sexually transmitted disease) 11/30/99, 12/06    HSV, 12/06  . Echocardiogram abnormal about 1998    physiological mitral & tricuspid regurge, but no MVP, No SBE prophylaxis  . HSV (herpes simplex virus) anogenital infection hx    Past Surgical History  Procedure Laterality Date  . Vagina surgery  1982    rectal/vaginal repair post partum  . Knee arthroplasty  1998    left knee reconstruction  . Colposcopy  10/01    no lesion  . Augmentation mammaplasty   01/08    reconstruction  . Foot surgery  2012    right     Current Outpatient Prescriptions  Medication Sig Dispense Refill  . calcium carbonate (OS-CAL) 600 MG TABS Take 600 mg by mouth 2 (two) times daily with a meal.    . Hyoscyamine Sulfate (LEVBID PO) Take 0.375 mg by mouth as needed.     . latanoprost (XALATAN) 0.005 % ophthalmic solution Place 1 drop into both eyes at bedtime.     . norethindrone-ethinyl estradiol (JUNEL FE,GILDESS FE,LOESTRIN FE) 1-20 MG-MCG tablet Take 1 tablet by mouth daily. Take continuous active pills daily 4 Package 3  . omeprazole (PRILOSEC) 20 MG capsule Take 20 mg by mouth daily.    . Probiotic Product (ALIGN) 4 MG CAPS Take 1 capsule by mouth daily.    . pseudoephedrine-acetaminophen (TYLENOL SINUS) 30-500 MG TABS Take 1 tablet by mouth every 4 (four) hours as needed.    . S-Adenosylmethionine (SAM-E PO) Take 200 mg by mouth daily.     . valACYclovir (VALTREX) 500 MG tablet TAKE 1 TABLET BY MOUTH TWICE A DAY 60 tablet 6  . Wheat Dextrin (BENEFIBER) POWD Take by mouth as needed.      No current facility-administered  medications for this visit.    Family History  Problem Relation Age of Onset  . Cervical cancer Mother   . Scoliosis Mother   . Diabetes Mother 62  . Cervical cancer Maternal Grandmother   . Lung cancer Maternal Grandfather     ROS:  Pertinent items are noted in HPI.  Otherwise, a comprehensive ROS was negative.  Exam:   BP 100/60 mmHg  Pulse 60  Ht 5\' 4"  (1.626 m)  Wt 162 lb (73.483 kg)  BMI 27.79 kg/m2  LMP 12/05/2013 (Approximate) Height: 5\' 4"  (162.6 cm) Ht Readings from Last 3 Encounters:  06/06/14 5\' 4"  (1.626 m)  05/31/13 5\' 4"  (1.626 m)  05/30/12 5\' 4"  (1.626 m)    General appearance: alert, cooperative and appears stated age Head: Normocephalic, without obvious abnormality, atraumatic Neck: no adenopathy, supple, symmetrical, trachea midline and thyroid normal to inspection and palpation Lungs: clear to  auscultation bilaterally Breasts: normal appearance, no masses or tenderness Heart: regular rate and rhythm Abdomen: soft, non-tender; no masses,  no organomegaly Extremities: extremities normal, atraumatic, no cyanosis or edema Skin: Skin color, texture, turgor normal. No rashes or lesions Lymph nodes: Cervical, supraclavicular, and axillary nodes normal. No abnormal inguinal nodes palpated Neurologic: Grossly normal   Pelvic: External genitalia:  no lesions              Urethra:  normal appearing urethra with no masses, tenderness or lesions              Bartholin's and Skene's: normal                 Vagina: normal appearing vagina with normal color and discharge, no lesions              Cervix: anteverted              Pap taken: Yes.   Bimanual Exam:  Uterus:  normal size, contour, position, consistency, mobility, non-tender              Adnexa: no mass, fullness, tenderness               Rectovaginal: Confirms               Anus:  normal sphincter tone, no lesions  Chaperone present: yes  A:  Well Woman with normal exam  OCP for cycle regulation History of Melasma - wants to try Nuva ring this summer history of CIN II 10/1999 History of Vit D deficiency   P:   Reviewed health and wellness pertinent to exam  Pap smear taken today  Mammogram is due 09/2014  Will see GI in 2 weeks and will get colonoscopy  Refill on Junel Fe 1/20 for a year  Refill on Valtrex for a year  Follow with labs - on OTC Vit D  Counseled on breast self exam, mammography screening, use and side effects of OCP's, adequate intake of calcium and vitamin D, diet and exercise return annually or prn  An After Visit Summary was printed and given to the patient.

## 2014-06-06 NOTE — Patient Instructions (Signed)

## 2014-06-07 LAB — URINALYSIS, MICROSCOPIC ONLY
BACTERIA UA: NONE SEEN
CASTS: NONE SEEN
CRYSTALS: NONE SEEN
Squamous Epithelial / LPF: NONE SEEN

## 2014-06-07 LAB — VITAMIN D 25 HYDROXY (VIT D DEFICIENCY, FRACTURES): VIT D 25 HYDROXY: 44 ng/mL (ref 30–100)

## 2014-06-07 LAB — URINE CULTURE
Colony Count: NO GROWTH
Organism ID, Bacteria: NO GROWTH

## 2014-06-09 NOTE — Progress Notes (Signed)
Encounter reviewed by Dr. Ilaria Much Silva.  

## 2014-06-10 LAB — HEMOGLOBIN, FINGERSTICK: HEMOGLOBIN, FINGERSTICK: 13.3 g/dL (ref 12.0–16.0)

## 2014-06-10 LAB — IPS PAP TEST WITH HPV

## 2014-06-11 ENCOUNTER — Telehealth: Payer: Self-pay

## 2014-06-11 ENCOUNTER — Telehealth: Payer: Self-pay | Admitting: Nurse Practitioner

## 2014-06-11 NOTE — Telephone Encounter (Signed)
Patient canceled her appointment for lab work. She states she is going to a [facility] and will have them check her liver and send over her results.

## 2014-06-11 NOTE — Telephone Encounter (Signed)
-----   Message from Antonietta Barcelona, [place] sent at 06/10/2014  5:51 PM EDT ----- Pap 08 recall.  Please let pt. Know that pap is slight abnormal but HR HPV is negative.  She has hx of CIN II.  Must get pap and co testing for next year.

## 2014-06-11 NOTE — Telephone Encounter (Signed)
Yes they can follow liver test

## 2014-06-11 NOTE — Telephone Encounter (Signed)
Milford Cage, FNP okay to close encounter?

## 2014-06-11 NOTE — Telephone Encounter (Signed)
Spoke with patient. Advised of all results as seen below. Patient is agreeable and verbalizes understanding. Lab appointment scheduled for 06/26/14 at 11:30am. Patient is agreeable to date and time. 08 recall entered.  Notes Recorded by Antonietta Barcelona, FNP on 06/10/2014 at 1:33 PM Please let pt. Know that CMP is normal except for 1 liver test that is elevated - she should repeat this in 2 weeks after avoiding all NSAID'S and ETOH. The TSH, Vit D, CBC, urine micro, Urine C&S is all normal. Lipid profile shows an elevated triglycerides and LDL.

## 2014-06-26 ENCOUNTER — Other Ambulatory Visit: Payer: BLUE CROSS/BLUE SHIELD

## 2014-08-26 ENCOUNTER — Other Ambulatory Visit: Payer: Self-pay | Admitting: Nurse Practitioner

## 2014-08-26 NOTE — Telephone Encounter (Signed)
Medication refill request: Valtrex 500 Last AEX:  06/06/14 with PG Next AEX: 06/17/15 with PG Last MMG (if hormonal medication request): 09/21/13 Bi-rads C 2 Benign Refill authorized: Please advise.

## 2015-06-17 ENCOUNTER — Encounter: Payer: Self-pay | Admitting: Nurse Practitioner

## 2015-06-17 ENCOUNTER — Ambulatory Visit (INDEPENDENT_AMBULATORY_CARE_PROVIDER_SITE_OTHER): Payer: BLUE CROSS/BLUE SHIELD | Admitting: Nurse Practitioner

## 2015-06-17 VITALS — BP 130/84 | HR 68 | Ht 64.0 in | Wt 138.0 lb

## 2015-06-17 DIAGNOSIS — Z0000 Encounter for general adult medical examination without abnormal findings: Secondary | ICD-10-CM | POA: Diagnosis not present

## 2015-06-17 DIAGNOSIS — Z01419 Encounter for gynecological examination (general) (routine) without abnormal findings: Secondary | ICD-10-CM

## 2015-06-17 LAB — CBC
HEMATOCRIT: 40 % (ref 35.0–45.0)
Hemoglobin: 13.2 g/dL (ref 11.7–15.5)
MCH: 31.4 pg (ref 27.0–33.0)
MCHC: 33 g/dL (ref 32.0–36.0)
MCV: 95 fL (ref 80.0–100.0)
MPV: 9.4 fL (ref 7.5–12.5)
Platelets: 284 10*3/uL (ref 140–400)
RBC: 4.21 MIL/uL (ref 3.80–5.10)
RDW: 12.6 % (ref 11.0–15.0)
WBC: 6.5 10*3/uL (ref 3.8–10.8)

## 2015-06-17 LAB — POCT URINALYSIS DIPSTICK
Bilirubin, UA: NEGATIVE
Glucose, UA: NEGATIVE
KETONES UA: NEGATIVE
Leukocytes, UA: NEGATIVE
Nitrite, UA: NEGATIVE
Protein, UA: NEGATIVE
Urobilinogen, UA: NEGATIVE
pH, UA: 5

## 2015-06-17 LAB — TSH: TSH: 1.83 mIU/L

## 2015-06-17 MED ORDER — NORETHIN ACE-ETH ESTRAD-FE 1-20 MG-MCG PO TABS: 1.0000 | ORAL_TABLET | Freq: Every day | ORAL | Status: DC

## 2015-06-17 MED ORDER — VALACYCLOVIR HCL 500 MG PO TABS: ORAL_TABLET | ORAL | Status: DC

## 2015-06-17 NOTE — Progress Notes (Signed)
Patient ID: Gabriela Figueroa, female   DOB: 1965-10-31, 50 y.o.   MRN: [id number]  50 y.o. G64P1021 Married  Caucasian Fe here for annual exam.  No new health problems.   Some vaso symptoms that are tolerable.  ammonirrhea on continuous OCP.  Her step son who had been injured is now moved out on his own.  Patient's last menstrual period was 12/05/2013.          Sexually active: Yes.    The current method of family planning is OCP (estrogen/progesterone). (Junel)  Exercising: Yes.  walking 3 times per week and gym once every 2 weeks Smoker:  no  Health Maintenance: Pap: 06/06/14, ASCUS with neg HR HPV MMG: 09/21/13, Bi-Rads 2: Negative Colonoscopy: 2016, benign polyps, repeat in 10 years ( She will bring info on date) TDaP: 2012 Hep C and HIV: drawn today, discuss HIV further Labs: HB: 12.6   Urine: negative   reports that she has never smoked. She has never used smokeless tobacco. She reports that she drinks about 2.0 oz of alcohol per week. She reports that she does not use illicit drugs.  Past Medical History  Diagnosis Date  . IBS (irritable bowel syndrome)   . Depression   . Abnormal pap 09/01    CIN1, II HR HPV  . H/O tension headache 09/01    family stressors  . Dyspareunia 03/04    PUS (neg)  . IC (interstitial cystitis) 11/2002    Dr. Amalia Hailey  . TMJ (temporomandibular joint syndrome) 2004  . GERD (gastroesophageal reflux disease) 10/04  . STD (sexually transmitted disease) 11/30/99, 12/06    HSV, 12/06  . Echocardiogram abnormal about 1998    physiological mitral & tricuspid regurge, but no MVP, No SBE prophylaxis  . HSV (herpes simplex virus) anogenital infection hx    Past Surgical History  Procedure Laterality Date  . Vagina surgery  1982    rectal/vaginal repair post partum  . Knee arthroplasty  1998    left knee reconstruction  . Colposcopy  10/01    no lesion  . Augmentation mammaplasty  01/08    reconstruction  . Foot surgery  2012    right     Current  Outpatient Prescriptions  Medication Sig Dispense Refill  . calcium carbonate (OS-CAL) 600 MG TABS Take 600 mg by mouth 2 (two) times daily with a meal.    . Hyoscyamine Sulfate (LEVBID PO) Take 0.375 mg by mouth as needed.     . latanoprost (XALATAN) 0.005 % ophthalmic solution Place 1 drop into both eyes at bedtime.     . norethindrone-ethinyl estradiol (JUNEL FE,GILDESS FE,LOESTRIN FE) 1-20 MG-MCG tablet Take 1 tablet by mouth daily. Take continuous active pills daily 4 Package 4  . omeprazole (PRILOSEC) 20 MG capsule Take 20 mg by mouth daily.    . pseudoephedrine-acetaminophen (TYLENOL SINUS) 30-500 MG TABS Take 1 tablet by mouth every 4 (four) hours as needed.    . S-Adenosylmethionine (SAM-E PO) Take 200 mg by mouth daily.     . valACYclovir (VALTREX) 500 MG tablet TAKE 1 TABLET BY MOUTH TWICE A DAY 60 tablet 8  . Wheat Dextrin (BENEFIBER) POWD Take by mouth as needed.      No current facility-administered medications for this visit.    Family History  Problem Relation Age of Onset  . Cervical cancer Mother   . Scoliosis Mother   . Diabetes Mother 110  . Cervical cancer Maternal Grandmother   . Lung cancer Maternal  Grandfather     ROS:  Pertinent items are noted in HPI.  Otherwise, a comprehensive ROS was negative.  Exam:   BP 130/84 mmHg  Pulse 68  Ht 5\' 4"  (1.626 m)  Wt 138 lb (62.596 kg)  BMI 23.68 kg/m2  LMP 12/05/2013 Height: 5\' 4"  (162.6 cm) Ht Readings from Last 3 Encounters:  06/17/15 5\' 4"  (1.626 m)  06/06/14 5\' 4"  (1.626 m)  05/31/13 5\' 4"  (1.626 m)    General appearance: alert, cooperative and appears stated age Head: Normocephalic, without obvious abnormality, atraumatic Neck: no adenopathy, supple, symmetrical, trachea midline and thyroid normal to inspection and palpation Lungs: clear to auscultation bilaterally Breasts: normal appearance, no masses or tenderness, positive findings: implants bilaterally Heart: regular rate and rhythm Abdomen: soft,  non-tender; no masses,  no organomegaly Extremities: extremities normal, atraumatic, no cyanosis or edema Skin: Skin color, texture, turgor normal. No rashes or lesions Lymph nodes: Cervical, supraclavicular, and axillary nodes normal. No abnormal inguinal nodes palpated Neurologic: Grossly normal   Pelvic: External genitalia:  no lesions              Urethra:  normal appearing urethra with no masses, tenderness or lesions              Bartholin's and Skene's: normal                 Vagina: normal appearing vagina with normal color and discharge, no lesions              Cervix: anteverted              Pap taken: Yes.   Bimanual Exam:  Uterus:  normal size, contour, position, consistency, mobility, non-tender              Adnexa: no mass, fullness, tenderness               Rectovaginal: Confirms               Anus:  normal sphincter tone, no lesions  Chaperone present: yes  A:  Well Woman with normal exam  OCP for cycle regulation history of CIN II 10/1999 History of Vit D deficiency    P:   Reviewed health and wellness pertinent to exam  Pap smear as above  Mammogram is due and will schedule today  Refill on OCP -aware that this is the last year of OCP and will make a decision on HRT by next year  Refill Valtrex for a year  Follow with labs - some of labs will be fasting  Counseled on breast self exam, mammography screening, use and side effects of OCP's, adequate intake of calcium and vitamin D, diet and exercise return annually or prn  An After Visit Summary was printed and given to the patient.

## 2015-06-17 NOTE — Patient Instructions (Signed)

## 2015-06-18 LAB — HIV ANTIBODY (ROUTINE TESTING W REFLEX): HIV 1&2 Ab, 4th Generation: NONREACTIVE

## 2015-06-18 LAB — VITAMIN D 25 HYDROXY (VIT D DEFICIENCY, FRACTURES): Vit D, 25-Hydroxy: 63 ng/mL (ref 30–100)

## 2015-06-18 LAB — HEPATITIS C ANTIBODY: HCV Ab: NEGATIVE

## 2015-06-19 LAB — IPS PAP TEST WITH HPV

## 2015-06-22 NOTE — Progress Notes (Signed)
Encounter reviewed by Dr. Aundria Rud. Needs mammogram done to continue with any hormonal treatment.

## 2015-06-24 LAB — HEMOGLOBIN, FINGERSTICK: HEMOGLOBIN, FINGERSTICK: 12.6 g/dL (ref 12.0–16.0)

## 2015-07-02 ENCOUNTER — Other Ambulatory Visit (INDEPENDENT_AMBULATORY_CARE_PROVIDER_SITE_OTHER): Payer: BLUE CROSS/BLUE SHIELD

## 2015-07-02 DIAGNOSIS — Z0000 Encounter for general adult medical examination without abnormal findings: Secondary | ICD-10-CM

## 2015-07-02 LAB — LIPID PANEL
Cholesterol: 202 mg/dL — ABNORMAL HIGH (ref 125–200)
HDL: 59 mg/dL (ref >=46)
LDL Cholesterol: 112 mg/dL (ref <130)
TRIGLYCERIDES: 156 mg/dL — AB (ref <150)
Total CHOL/HDL Ratio: 3.4 Ratio (ref <=5.0)
VLDL: 31 mg/dL — ABNORMAL HIGH (ref <30)

## 2015-07-02 LAB — COMPREHENSIVE METABOLIC PANEL
ALBUMIN: 4.3 g/dL (ref 3.6–5.1)
ALK PHOS: 39 U/L (ref 33–130)
ALT: 22 U/L (ref 6–29)
AST: 18 U/L (ref 10–35)
BUN: 21 mg/dL (ref 7–25)
CHLORIDE: 103 mmol/L (ref 98–110)
CO2: 25 mmol/L (ref 20–31)
CREATININE: 0.81 mg/dL (ref 0.50–1.05)
Calcium: 9.7 mg/dL (ref 8.6–10.4)
Glucose, Bld: 83 mg/dL (ref 65–99)
POTASSIUM: 4.7 mmol/L (ref 3.5–5.3)
Sodium: 137 mmol/L (ref 135–146)
TOTAL PROTEIN: 7.1 g/dL (ref 6.1–8.1)
Total Bilirubin: 1.3 mg/dL — ABNORMAL HIGH (ref 0.2–1.2)

## 2015-08-12 ENCOUNTER — Telehealth: Payer: Self-pay | Admitting: Emergency Medicine

## 2015-08-12 NOTE — Telephone Encounter (Signed)
Call to patient. She is in mammogram hold for screening mammogram. Last mammogram completed at [facility] in 2015, available in [place].   Message left to return call to [place] at [phone].   Needs to schedule mammogram to continue with oral contraceptives.

## 2016-01-20 ENCOUNTER — Telehealth: Payer: Self-pay

## 2016-01-20 NOTE — Telephone Encounter (Signed)
I agree with removal from mammogram hold.

## 2016-01-20 NOTE — Telephone Encounter (Signed)
Call to patient. She is in mammogram hold for screening mammogram. Spoke with patient. Patient states that she had her screening mammogram in August of this year. Unsure of the location. Per review of Care Everywhere the patient was seen with [facility] on 09/26/2015 for bilateral screening mammogram. Birads 2: benign. Please see results via Care Everywhere. Will remove from mammogram hold.  Cc: Dr.Silva  Routing to provider for final review. Patient agreeable to disposition. Will close encounter.

## 2016-04-28 ENCOUNTER — Ambulatory Visit (INDEPENDENT_AMBULATORY_CARE_PROVIDER_SITE_OTHER): Payer: BLUE CROSS/BLUE SHIELD | Admitting: Certified Nurse Midwife

## 2016-04-28 ENCOUNTER — Encounter: Payer: Self-pay | Admitting: Certified Nurse Midwife

## 2016-04-28 ENCOUNTER — Telehealth: Payer: Self-pay | Admitting: Nurse Practitioner

## 2016-04-28 VITALS — BP 110/80 | HR 68 | Resp 16 | Ht 64.0 in | Wt 153.0 lb

## 2016-04-28 DIAGNOSIS — N760 Acute vaginitis: Secondary | ICD-10-CM | POA: Diagnosis not present

## 2016-04-28 DIAGNOSIS — B9689 Other specified bacterial agents as the cause of diseases classified elsewhere: Secondary | ICD-10-CM

## 2016-04-28 DIAGNOSIS — B373 Candidiasis of vulva and vagina: Secondary | ICD-10-CM

## 2016-04-28 DIAGNOSIS — B3731 Acute candidiasis of vulva and vagina: Secondary | ICD-10-CM

## 2016-04-28 MED ORDER — FLUCONAZOLE 150 MG PO TABS: 150.0000 mg | ORAL_TABLET | Freq: Once | ORAL | 0 refills | Status: AC

## 2016-04-28 MED ORDER — METRONIDAZOLE 0.75 % VA GEL: 1.0000 | Freq: Two times a day (BID) | VAGINAL | 0 refills | Status: DC

## 2016-04-28 NOTE — Patient Instructions (Signed)

## 2016-04-28 NOTE — Progress Notes (Signed)
51 y.o. Married Caucasian female [phone] here with complaint of vaginal symptoms of itching,  and increase discharge. Describes discharge as white,no odor. Onset of symptoms 2-3 days ago. Denies new personal products or vaginal dryness. No STD concerns. Urinary symptoms none . Contraception is OCP. Last sexual activity 5 days ago and has increased since then. No other health issues today.  ROS Pertinent to HPI  O:Healthy female WDWN Affect: normal, orientation x 3  Exam:Skin: warm and dry Abdomen:non tender  inguinal Lymph nodes: no enlargement or tenderness Pelvic exam: External genital: normal female, with scaling and no exudate BUS: negative Vagina: white slightly odorous discharge noted. Ph: 4.5   ,Wet prep taken,  Cervix: normal, non tender, no CMT Uterus: normal, non tender Adnexa:normal, non tender, no masses or fullness noted   Wet Prep results: KOH,Saline + clue, + yeast   A:Normal pelvic exam BV Yeast vaginitis   P:Discussed findings of BV and Yeast vaginitis and etiology. Discussed Aveeno or baking soda sitz bath for comfort. Avoid moist clothes or pads for extended period of time. If working out in gym clothes for long periods of time change underwear if possible. Coconut Oil use for skin protection prior to activity can be used to external skin for protection or dryness. Rx:  Diflucan see order with instructions Rx.Metrogel see order with instructions  Rv prn

## 2016-04-28 NOTE — Telephone Encounter (Signed)
erro  neous encounter

## 2016-05-03 NOTE — Progress Notes (Signed)
Encounter reviewed Jill Jertson, MD   

## 2016-06-22 ENCOUNTER — Ambulatory Visit (INDEPENDENT_AMBULATORY_CARE_PROVIDER_SITE_OTHER): Payer: BLUE CROSS/BLUE SHIELD | Admitting: Nurse Practitioner

## 2016-06-22 ENCOUNTER — Encounter: Payer: Self-pay | Admitting: Nurse Practitioner

## 2016-06-22 VITALS — BP 122/70 | HR 72 | Resp 16 | Ht 63.75 in | Wt 159.0 lb

## 2016-06-22 DIAGNOSIS — Z01419 Encounter for gynecological examination (general) (routine) without abnormal findings: Secondary | ICD-10-CM | POA: Diagnosis not present

## 2016-06-22 DIAGNOSIS — Z0000 Encounter for general adult medical examination without abnormal findings: Secondary | ICD-10-CM

## 2016-06-22 LAB — LIPID PANEL
CHOL/HDL RATIO: 3 ratio (ref <5.0)
Cholesterol: 186 mg/dL (ref <200)
HDL: 63 mg/dL (ref >50)
LDL CALC: 90 mg/dL (ref <100)
TRIGLYCERIDES: 163 mg/dL — AB (ref <150)
VLDL: 33 mg/dL — ABNORMAL HIGH (ref <30)

## 2016-06-22 LAB — TSH: TSH: 1.83 mIU/L

## 2016-06-22 MED ORDER — ESCITALOPRAM OXALATE 10 MG PO TABS: 10.0000 mg | ORAL_TABLET | Freq: Every day | ORAL | 0 refills | Status: DC

## 2016-06-22 MED ORDER — VALACYCLOVIR HCL 500 MG PO TABS: ORAL_TABLET | ORAL | 8 refills | Status: DC

## 2016-06-22 NOTE — Progress Notes (Signed)
Encounter reviewed by Dr. Brook Amundson C. Silva.  

## 2016-06-22 NOTE — Patient Instructions (Signed)

## 2016-06-22 NOTE — Progress Notes (Signed)
51 y.o. G49P1021 Married  Caucasian Fe here for annual exam. Pt is currently amenorrhea for yrs on OCP.  She know that we are going to DC OCP and consider HRT.  She is quite worried as to how bad her symptoms will be since she is already having vaso symptoms, moods and temperament is not good, and concentration is not good.  She has 2 more pkg of pills then plans are to come off and then in 2-4 weeks check [place] level.     Patient's last menstrual period was 12/05/2013 (approximate).          Sexually active: Yes.    The current method of family planning is OCP (estrogen/progesterone).    Exercising: Yes.    walking Smoker:  no  Health Maintenance: Pap: 06/17/15, Negative with neg HR HPV         06/06/14, ASCUS with neg HR HPV History of Abnormal Pap: yes MMG:  09/26/2015  Self Breast exams: occasionally Colonoscopy:  07/30/2014, possible serous adenoma polyp, repeat in 5 years TDaP:  04/20/2010 Hep C and HIV: 06/17/15 both are negative Labs: discuss today   reports that she has never smoked. She has never used smokeless tobacco. She reports that she drinks about 2.0 oz of alcohol per week . She reports that she does not use drugs.  Past Medical History:  Diagnosis Date  . Abnormal pap 09/01   CIN1, II HR HPV  . Depression   . Dyspareunia 03/04   PUS (neg)  . Echocardiogram abnormal about 1998   physiological mitral & tricuspid regurge, but no MVP, No SBE prophylaxis  . GERD (gastroesophageal reflux disease) 10/04  . H/O tension headache 09/01   family stressors  . HSV (herpes simplex virus) anogenital infection hx  . IBS (irritable bowel syndrome)   . IC (interstitial cystitis) 11/2002   Dr. Amalia Hailey  . Rotator cuff injury    partial tear left arm  . STD (sexually transmitted disease) 11/30/99, 12/06   HSV, 12/06  . TMJ (temporomandibular joint syndrome) 2004    Past Surgical History:  Procedure Laterality Date  . AUGMENTATION MAMMAPLASTY  01/08   reconstruction  . COLPOSCOPY   10/01   no lesion  . FOOT SURGERY  2012   right   . KNEE ARTHROPLASTY  1998   left knee reconstruction  . VAGINA SURGERY  1982   rectal/vaginal repair post partum    Current Outpatient Prescriptions  Medication Sig Dispense Refill  . calcium carbonate (OS-CAL) 600 MG TABS Take 600 mg by mouth 2 (two) times daily with a meal.    . EVENING PRIMROSE OIL PO Take by mouth. Take 1300mg  once daily    . Hyoscyamine Sulfate (LEVBID PO) Take 0.375 mg by mouth daily. Takes 1 tablet    . latanoprost (XALATAN) 0.005 % ophthalmic solution Place 1 drop into both eyes at bedtime.     . Multiple Vitamins-Minerals (MULTIVITAMIN PO) Take by mouth daily.    . Omega-3 Fatty Acids (FISH OIL) 1000 MG CAPS Take by mouth daily.    Marland Kitchen omeprazole (PRILOSEC) 20 MG capsule Take 20 mg by mouth daily.    . S-Adenosylmethionine (SAM-E PO) Take 200 mg by mouth daily.     . valACYclovir (VALTREX) 500 MG tablet TAKE 1 TABLET BY MOUTH TWICE A DAY 60 tablet 8   No current facility-administered medications for this visit.     Family History  Problem Relation Age of Onset  . Cervical cancer Mother   .  Scoliosis Mother   . Diabetes Mother 17  . Cervical cancer Maternal Grandmother   . Lung cancer Maternal Grandfather     ROS:  Pertinent items are noted in HPI.  Otherwise, a comprehensive ROS was negative.  Exam:   BP 122/70 (BP Location: Right Arm, Patient Position: Sitting, Cuff Size: Normal)   Pulse 72   Resp 16   Ht 5' 3.75" (1.619 m)   Wt 159 lb (72.1 kg)   LMP 12/05/2013 (Approximate)   BMI 27.51 kg/m  Height: 5' 3.75" (161.9 cm) Ht Readings from Last 3 Encounters:  06/22/16 5' 3.75" (1.619 m)  04/28/16 5\' 4"  (1.626 m)  06/17/15 5\' 4"  (1.626 m)    General appearance: alert, cooperative and appears stated age Head: Normocephalic, without obvious abnormality, atraumatic Neck: no adenopathy, supple, symmetrical, trachea midline and thyroid normal to inspection and palpation Lungs: clear to  auscultation bilaterally Breasts: normal appearance, no masses or tenderness Heart: regular rate and rhythm Abdomen: soft, non-tender; no masses,  no organomegaly Extremities: extremities normal, atraumatic, no cyanosis or edema Skin: Skin color, texture, turgor normal. No rashes or lesions Lymph nodes: Cervical, supraclavicular, and axillary nodes normal. No abnormal inguinal nodes palpated Neurologic: Grossly normal   Pelvic: External genitalia:  no lesions              Urethra:  normal appearing urethra with no masses, tenderness or lesions              Bartholin's and Skene's: normal                 Vagina: normal appearing vagina with normal color and discharge, no lesions              Cervix: anteverted              Pap taken: Yes.   Bimanual Exam:  Uterus:  normal size, contour, position, consistency, mobility, non-tender              Adnexa: no mass, fullness, tenderness               Rectovaginal: Confirms               Anus:  normal sphincter tone, no lesions  Chaperone present: yes  A:  Well Woman with normal exam      OCP for cycle regulation - but now going off History of CIN II 10/1999 History of Vit D deficiency  Mood changes and increased in vaso symptoms   P:   Reviewed health and wellness pertinent to exam  Pap smear: yes  Mammogram is due 09/2016  Will try her on Lexapro 10 mg in the evening for 3 months  Then to return for consult visit and [place] - by that time will be off OCP x 1 month.  She is given potential side effects of med's.  We have discussed WHO study and risk and benefits of HRT and is willing to try HRT for quality of life issues.  She is given the risk including DVT, CVA, cancer , etc.  Will also follow with labs.  Counseled on breast self exam, mammography screening, use and side effects of HRT, adequate intake of calcium and vitamin D, diet and exercise return annually or prn  An After Visit Summary was  printed and given to the patient.

## 2016-06-24 LAB — IPS PAP TEST WITH HPV

## 2016-08-20 ENCOUNTER — Other Ambulatory Visit: Payer: Self-pay | Admitting: Nurse Practitioner

## 2016-08-20 ENCOUNTER — Telehealth: Payer: Self-pay | Admitting: Obstetrics and Gynecology

## 2016-08-20 NOTE — Telephone Encounter (Signed)
Left message regarding upcoming "3 month recheck" appointment has been canceled and needs to be rescheduled. Letter mailed to patient.

## 2016-08-20 NOTE — Telephone Encounter (Addendum)
Medication refill request: Junel  Last AEX:  06/22/16 PG Next AEX:  Last MMG (if hormonal medication request): 09/21/13 BIRADS2:Benign  Refill authorized: Patient should be going off rx - per last AEX note   Rx refused

## 2016-09-13 ENCOUNTER — Other Ambulatory Visit: Payer: Self-pay | Admitting: Nurse Practitioner

## 2016-09-13 NOTE — Telephone Encounter (Signed)
Medication refill request: Lexapro  Last AEX:  06-22-16  Next AEX: 07-06-17  Last MMG (if hormonal medication request): 09-21-13 WNL  Refill authorized: please advise  

## 2016-09-16 ENCOUNTER — Other Ambulatory Visit: Payer: Self-pay | Admitting: Nurse Practitioner

## 2016-09-22 ENCOUNTER — Telehealth: Payer: Self-pay | Admitting: Certified Nurse Midwife

## 2016-09-22 ENCOUNTER — Ambulatory Visit: Payer: BLUE CROSS/BLUE SHIELD | Admitting: Nurse Practitioner

## 2016-09-22 NOTE — Telephone Encounter (Signed)
Spoke with patient. Reports taking Lexapro for 3-4 months now, has f/u scheduled for 8/23. Reports worsening side effects of inability to focus, memory loss, fatigue and muscle weakness.   Stopped Junel on 09/06/16, has been experiencing increase in hot flashes and night sweats. Had been taking for 8 years. Walks outside and feels like she is going to pass out d/t heat.   Advised patient will review with Melvia Heaps, CNM And return call with recommendations, patient is agreeable.   Melvia Heaps, CNM -please review and advise?

## 2016-09-22 NOTE — Telephone Encounter (Signed)
Reviewed with Melvia Heaps, CNM.   LMP? Taking Lexapro a.m vs p.m? If patient feels symptoms are med related and want's to stop medication, recommended weaning Lexapro - 5 mg daily for 1 week, monitor symptoms. Can then reduce to 5 mg every other day.    Call to patient, reports LMP "many years ago, was taking Junel continuous, so unsure if I would have had a cycle". Takes Lexapro in the morning, will try taking at night, wean down and monitor sypmotms. Patient declined OV for today, will call with update and keep OV as scheduled for 10/07/16. Patient verbalizes understanding and is agreeable.  Routing to provider for final review. Patient is agreeable to disposition. Will close encounter.

## 2016-09-22 NOTE — Telephone Encounter (Signed)
Patient states Patty prescribed Lexapro and she is having a lot of side effects such as memory and inability to focus, night sweats and pain in hands and would like some advice.   Patient also states she has been off of Junel for over 2 weeks per Patty's recommendation.    She has an appointment scheduled with Debbi on 08/07/16 to follow up on medication.

## 2016-09-22 NOTE — Telephone Encounter (Signed)
Agree with plan.,

## 2016-10-07 ENCOUNTER — Ambulatory Visit (INDEPENDENT_AMBULATORY_CARE_PROVIDER_SITE_OTHER): Payer: BLUE CROSS/BLUE SHIELD | Admitting: Certified Nurse Midwife

## 2016-10-07 ENCOUNTER — Encounter: Payer: Self-pay | Admitting: Certified Nurse Midwife

## 2016-10-07 VITALS — BP 110/72 | HR 68 | Resp 16 | Ht 64.0 in | Wt 153.0 lb

## 2016-10-07 DIAGNOSIS — N951 Menopausal and female climacteric states: Secondary | ICD-10-CM

## 2016-10-07 NOTE — Patient Instructions (Signed)

## 2016-10-07 NOTE — Progress Notes (Signed)
51 y.o. Married Caucasian female [phone] here for follow up of Lexapro initiated on 06/22/16 by Waldemar Dickens for menopausal symptoms. Patient has reduced dosage to 1/4 of pill with good sleep pattern. Had some memory changes with full dose of 10 mg, this stopped once she changed dosage.Marland KitchenHas noted that irritability has returned with 1/2 dose. Hot flashes have not increased. Some vaginal dryness changes. Patient has increase stress with caring for family and handicapped child and desires to stay on Lexapro, Interested in HRT for menopausal symptom relief. As been of OCP since 06/22/16 with no bleeding. Had amenorrhea while taking OCP. No other health concerns today.  ROS: pertinent to above  O: Healthy WD,WN female Affect: Normal, orientation x 3  A: Anxiety with Lexapro use working well with reduced dosage ? Perimenopausal/menopausal symptomatic interested in HRT Mammogram due  P:Discussed staying on Lexapro using 5 mg at hs and see if hot flashes and irritability are controlled, while she considers HRT. Discussed seeking social support with other family , friends with stress situations at home. Patient not sure if she would consider counseling, but will think about.  Discussed perimenopausal/menopausal etiology and expectations of symptoms. Discussed HRT risks/benefits and expectations. Would do low dose cyclic her preference, but discussed Vivelle dot/ Provera use with slightly reduced risk. Has family history of cervical cancer and decrease risk would be important consideration. Will do labs for evaluation and will discuss once in. Patient will schedule mammogram and will not start any HRT until results in. Labs :FSH, TSH, Hgb A1-C  RV prn, aex   35 minutes spent in consultation time and evaluation of medication options.

## 2016-10-08 LAB — HEMOGLOBIN A1C
ESTIMATED AVERAGE GLUCOSE: 97 mg/dL
HEMOGLOBIN A1C: 5 % (ref 4.8–5.6)

## 2016-10-08 LAB — FOLLICLE STIMULATING HORMONE: FSH: 83.3 m[IU]/mL

## 2016-10-08 LAB — TSH: TSH: 2.21 u[IU]/mL (ref 0.450–4.500)

## 2016-10-14 ENCOUNTER — Other Ambulatory Visit: Payer: Self-pay | Admitting: Certified Nurse Midwife

## 2016-10-14 DIAGNOSIS — N951 Menopausal and female climacteric states: Secondary | ICD-10-CM

## 2016-10-17 ENCOUNTER — Other Ambulatory Visit: Payer: Self-pay | Admitting: Obstetrics and Gynecology

## 2016-10-19 NOTE — Telephone Encounter (Signed)
Medication refill request: Lexapro  Last AEX:  06-22-16  Next AEX: 07-06-17  Last MMG (if hormonal medication request): 09-21-13 WNL  Refill authorized: please advise

## 2016-11-23 ENCOUNTER — Other Ambulatory Visit: Payer: BLUE CROSS/BLUE SHIELD

## 2016-11-23 DIAGNOSIS — N951 Menopausal and female climacteric states: Secondary | ICD-10-CM

## 2016-11-24 ENCOUNTER — Other Ambulatory Visit: Payer: BLUE CROSS/BLUE SHIELD

## 2016-11-26 ENCOUNTER — Encounter: Payer: Self-pay | Admitting: Rehabilitative and Restorative Service Providers"

## 2016-11-26 ENCOUNTER — Ambulatory Visit (INDEPENDENT_AMBULATORY_CARE_PROVIDER_SITE_OTHER): Payer: BLUE CROSS/BLUE SHIELD | Admitting: Rehabilitative and Restorative Service Providers"

## 2016-11-26 DIAGNOSIS — M25512 Pain in left shoulder: Secondary | ICD-10-CM

## 2016-11-26 DIAGNOSIS — R29898 Other symptoms and signs involving the musculoskeletal system: Secondary | ICD-10-CM

## 2016-11-26 NOTE — Therapy (Signed)
[facility], [place], [address] Phone: [phone]   Fax:  [phone]  Physical Therapy Evaluation  Patient Details  Name: Gabriela Figueroa MRN: [id number] Date of Birth: 1965-05-31 Referring Provider: Dr Leanora Cover  Encounter Date: 11/26/2016      PT End of Session - 11/26/16 1019    Visit Number 1   Number of Visits 12   Date for PT Re-Evaluation 01/07/17   PT Start Time 0086   PT Stop Time 1116   PT Time Calculation (min) 61 min   Activity Tolerance Patient tolerated treatment well      Past Medical History:  Diagnosis Date  . Abnormal pap 09/01   CIN1, II HR HPV  . Depression   . Dyspareunia 03/04   PUS (neg)  . Echocardiogram abnormal about 1998   physiological mitral & tricuspid regurge, but no MVP, No SBE prophylaxis  . GERD (gastroesophageal reflux disease) 10/04  . H/O tension headache 09/01   family stressors  . HSV (herpes simplex virus) anogenital infection hx  . IBS (irritable bowel syndrome)   . IC (interstitial cystitis) 11/2002   Dr. Amalia Hailey  . Rotator cuff injury    partial tear left arm  . STD (sexually transmitted disease) 11/30/99, 12/06   HSV, 12/06  . TMJ (temporomandibular joint syndrome) 2004    Past Surgical History:  Procedure Laterality Date  . AUGMENTATION MAMMAPLASTY  01/08   reconstruction  . COLPOSCOPY  10/01   no lesion  . FOOT SURGERY  2012   right   . KNEE ARTHROPLASTY  1998   left knee reconstruction  . VAGINA SURGERY  1982   rectal/vaginal repair post partum    There were no vitals filed for this visit.       Subjective Assessment - 11/26/16 1022    Subjective Juliann Pulse reports that she has had pain in the Lt shoulder pain over the past 6 months. She has been seen in PT for treatment with some improvement but she continues to have symptoms. She does not know of any injury to shoulder. ?She has noticed similar symptoms in the Rt shoulder  but not as intense in the past 4-6 weeks. She has responded well to manual work through shoulder and neck. Spinning and dizziness in past couple of weeks(has had BPPV in past).    Pertinent History cervical problems at least 10 years; lateral epicondylitis; BPPV; history of LBP    Diagnostic tests xray    Patient Stated Goals get rid of the shoulder pain    Currently in Pain? Yes   Pain Score 4    Pain Location Shoulder   Pain Orientation Left   Pain Descriptors / Indicators Dull   Pain Type Chronic pain   Pain Radiating Towards between shoulder and elbow    Pain Onset More than a month ago   Pain Frequency Intermittent   Aggravating Factors  lifting arm; outward rotation; vacuuming; cleaning; lying on the Lt side at night    Pain Relieving Factors not using the Lt arm; sitting with arm propped; massage            [facility] PT Assessment - 11/26/16 0001      Assessment   Medical Diagnosis Lt shoulder dysfunction   Referring Provider Dr Leanora Cover   Onset Date/Surgical Date 05/16/16   Hand Dominance Right   Next MD Visit PRN    Prior Therapy yes - outpatient  Precautions   Precautions None     Balance Screen   Has the patient fallen in the past 6 months No   Has the patient had a decrease in activity level because of a fear of falling?  No   Is the patient reluctant to leave their home because of a fear of falling?  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   Leisure household chores; beach[place] home      Observation/Other Assessments   Focus on Therapeutic Outcomes (FOTO)  50 % limitation      Sensation   Additional Comments at night Lt > Rt fingers feel numb some fingers to whole hand      Posture/Postural Control   Posture Comments head forward; shoulders rounded and elevated; scapulae abducted and rotated along the thoracic wall; increased thoracic kyphosis      AROM    Right Shoulder Extension 65 Degrees   Right Shoulder Flexion 157 Degrees   Right Shoulder ABduction 162 Degrees   Right Shoulder Internal Rotation 40 Degrees  pain   Right Shoulder External Rotation 90 Degrees   Left Shoulder Extension 61 Degrees   Left Shoulder Flexion 155 Degrees   Left Shoulder ABduction 167 Degrees   Left Shoulder Internal Rotation 14 Degrees  pain    Left Shoulder External Rotation 90 Degrees   Cervical Flexion 38   Cervical Extension 47   Cervical - Right Side Bend 31   Cervical - Left Side Bend 13   Cervical - Right Rotation 63   Cervical - Left Rotation 57     Strength   Overall Strength Comments 5/5 pain with resisted Lt shoulder extension and IR      Palpation   Palpation comment tightness noted in Lt anterior shoulder - biceps; lat attachment; deltoid; posterior shoulder lats/teres; lumbar spine lats/QL             Objective measurements completed on examination: See above findings.          [facility] Adult PT Treatment/Exercise - 11/26/16 0001      Shoulder Exercises: Stretch   Other Shoulder Stretches supine hips and knees bent; palm toward face bringing Lt UE up over head 30 sec x 3      Moist Heat Therapy   Number Minutes Moist Heat 20 Minutes   Moist Heat Location Shoulder;Cervical  Lt     Electrical Stimulation   Electrical Stimulation Location Lt shoulder/trap   Electrical Stimulation Action IFC   Electrical Stimulation Parameters to tolerance   Electrical Stimulation Goals Pain;Tone     Manual Therapy   Manual therapy comments pt supine    Joint Mobilization Lt GH joint circumduction    Soft tissue mobilization posterior shoudler through lats/teres    Passive ROM Lt shoulder flexion                 PT Education - 11/26/16 1101    Education provided Yes   Education Details HEP TENS DN    Person(s) Educated Patient   Methods Explanation;Demonstration;Tactile cues;Verbal cues;Handout   Comprehension Verbalized  understanding;Returned demonstration;Verbal cues required;Tactile cues required             PT Long Term Goals - 11/26/16 1313      PT LONG TERM GOAL #1   Title Decrease Lt shoulder pain with patient to report 50-75% reduction in pain 01/07/17  Time 6   Period Weeks   Status New     PT LONG TERM GOAL #2   Title Pan free resisted shoulder extension and IR 01/07/17   Time 6   Period Weeks   Status New     PT LONG TERM GOAL #3   Title Return to normal functional activities such as vacuuming without pain and limitation 01/07/17   Time 6   Period Weeks   Status New     PT LONG TERM GOAL #4   Title Independent in HEP 01/07/17   Time 6   Period Weeks   Status New     PT LONG TERM GOAL #5   Title Improve FOTO to </= 35% limitation 01/07/17   Time 6   Period Weeks   Status New                Plan - 11/26/16 1144    Clinical Impression Statement Juliann Pulse presents with > 6 month history of Lt shoulder pain. She does not know of any injury causing symptoms. Juliann Pulse has had several sessions of PT with partial resolution of pain. However she continues to have pain with certain movements of her shoulder and arm - especially bringing arm idown and in. She has good ROM and strength but pain with resisted Lt shoulder extension and IR. Juliann Pulse has pain with palaption through the anterior shoulder - anterior deltoid/insertion of lats; posterior shoulder through the teres and proximal lats; lumbar spine through the lats and QL. She has persistent pain and limited functional activity level. patient has cervical tightness and limited cervical ROM as well as positional dizziness.  Patient will benefit form PT to address porblems identified.    Clinical Presentation Stable   Clinical Decision Making Low   Rehab Potential Good   PT Frequency 2x / week   PT Duration 6 weeks   PT Treatment/Interventions Patient/family education;ADLs/Self Care Home Management;Cryotherapy;Electrical  Stimulation;Iontophoresis 4mg /ml Dexamethasone;Moist Heat;Ultrasound;Dry needling;Manual techniques;Therapeutic activities;Therapeutic exercise;Neuromuscular re-education   PT Next Visit Plan review lat stretch and try to progress stretch; manual work through [employer]; modalities as indicated; possibly DN Further assessment of dizziness as indicated    Consulted and Agree with Plan of Care Patient      Patient will benefit from skilled therapeutic intervention in order to improve the following deficits and impairments:  Postural dysfunction, Improper body mechanics, Increased fascial restricitons, Increased muscle spasms, Pain, Decreased activity tolerance  Visit Diagnosis: Acute pain of left shoulder - Plan: PT plan of care cert/re-cert  Other symptoms and signs involving the musculoskeletal system - Plan: PT plan of care cert/re-cert     Problem List There are no active problems to display for this patient.   Christo Hain Nilda Simmer PT, MPH  11/26/2016, 1:19 PM  [facility] [place], [place], [address] Phone: [phone]   Fax:  [phone]  Name: Haileyann Staiger MRN: [id number] Date of Birth: 1965/09/21

## 2016-11-26 NOTE — Patient Instructions (Signed)
Lying on back; hips and knees bent; turn left palm toward face, push left arm up over your head  Hold 30 sec repeat 3 reps; 2-3 times/day  TENS UNIT: This is helpful for muscle pain and spasm.   Search and Purchase a TENS 7000 2nd edition at www.tenspros.com. It should be less than $30.     TENS unit instructions: Do not shower or bathe with the unit on Turn the unit off before removing electrodes or batteries If the electrodes lose stickiness add a drop of water to the electrodes after they are disconnected from the unit and place on plastic sheet. If you continued to have difficulty, call the TENS unit company to purchase more electrodes. Do not apply lotion on the skin area prior to use. Make sure the skin is clean and dry as this will help prolong the life of the electrodes. After use, always check skin for unusual red areas, rash or other skin difficulties. If there are any skin problems, does not apply electrodes to the same area. Never remove the electrodes from the unit by pulling the wires. Do not use the TENS unit or electrodes other than as directed. Do not change electrode placement without consultating your therapist or physician. Keep 2 fingers with between each electrode.   Trigger Point Dry Needling  . What is Trigger Point Dry Needling (DN)? o DN is a physical therapy technique used to treat muscle pain and dysfunction. Specifically, DN helps deactivate muscle trigger points (muscle knots).  o A thin filiform needle is used to penetrate the skin and stimulate the underlying trigger point. The goal is for a local twitch response (LTR) to occur and for the trigger point to relax. No medication of any kind is injected during the procedure.   . What Does Trigger Point Dry Needling Feel Like?  o The procedure feels different for each individual patient. Some patients report that they do not actually feel the needle enter the skin and overall the process is not painful. Very  mild bleeding may occur. However, many patients feel a deep cramping in the muscle in which the needle was inserted. This is the local twitch response.   Marland Kitchen How Will I feel after the treatment? o Soreness is normal, and the onset of soreness may not occur for a few hours. Typically this soreness does not last longer than two days.  o Bruising is uncommon, however; ice can be used to decrease any possible bruising.  o In rare cases feeling tired or nauseous after the treatment is normal. In addition, your symptoms may get worse before they get better, this period will typically not last longer than 24 hours.   . What Can I do After My Treatment? o Increase your hydration by drinking more water for the next 24 hours. o You may place ice or heat on the areas treated that have become sore, however, do not use heat on inflamed or bruised areas. Heat often brings more relief post needling. o You can continue your regular activities, but vigorous activity is not recommended initially after the treatment for 24 hours. o DN is best combined with other physical therapy such as strengthening, stretching, and other therapies.

## 2016-11-30 LAB — ANTI MULLERIAN HORMONE: ANTI-MULLERIAN HORMONE (AMH): <0.015 ng/mL

## 2016-12-02 ENCOUNTER — Telehealth: Payer: Self-pay | Admitting: Certified Nurse Midwife

## 2016-12-02 ENCOUNTER — Other Ambulatory Visit: Payer: Self-pay | Admitting: Certified Nurse Midwife

## 2016-12-02 DIAGNOSIS — N951 Menopausal and female climacteric states: Secondary | ICD-10-CM

## 2016-12-02 MED ORDER — CONJ ESTROG-MEDROXYPROGEST ACE 0.3-1.5 MG PO TABS: 1.0000 | ORAL_TABLET | Freq: Every day | ORAL | 2 refills | Status: DC

## 2016-12-02 NOTE — Telephone Encounter (Signed)
Bilateral screening mammogram results received and to Melvia Heaps CNM for review for start on HRT.

## 2016-12-02 NOTE — Telephone Encounter (Signed)
Patient said she got results from her mammogram on Monday and wanted to let nurse know so she can get started on hormone replacement therapy.

## 2016-12-02 NOTE — Telephone Encounter (Signed)
Please let patient know order placed for Prempro one pill daily. Report if vaginal bleeding or any warning signs. Needs follow up visit in 6 weeks. Please schedule. Reviewed mammogram.

## 2016-12-02 NOTE — Telephone Encounter (Signed)
Spoke with patient. Advised of message as seen below from [place]. Patient verbalizes understanding. 6 week follow up scheduled for 03/08/2017 at 9:30 am with Melvia Heaps CNM. Patient is agreeable to date and time.  Routing to provider for final review. Patient agreeable to disposition. Will close encounter.

## 2016-12-02 NOTE — Telephone Encounter (Signed)
Request for mammogram report faxed to [facility] [phone].

## 2016-12-03 ENCOUNTER — Encounter: Payer: Self-pay | Admitting: Rehabilitative and Restorative Service Providers"

## 2016-12-03 ENCOUNTER — Ambulatory Visit (INDEPENDENT_AMBULATORY_CARE_PROVIDER_SITE_OTHER): Payer: BLUE CROSS/BLUE SHIELD | Admitting: Rehabilitative and Restorative Service Providers"

## 2016-12-03 ENCOUNTER — Telehealth: Payer: Self-pay

## 2016-12-03 DIAGNOSIS — R29898 Other symptoms and signs involving the musculoskeletal system: Secondary | ICD-10-CM

## 2016-12-03 DIAGNOSIS — M25512 Pain in left shoulder: Secondary | ICD-10-CM | POA: Diagnosis not present

## 2016-12-03 NOTE — Telephone Encounter (Signed)
Spoke with patient. Results given. Patient verbalizes understanding. Patient states that she feels well on Lexapro. Is sleeping well and has less stress. Is still having increased hot flashes. Was placed on Prempro 0.3-1.5 mg daily yesterday by Melvia Heaps CNM based on mammogram results. Please see telephone encounter dated 12/02/2016.  Notes recorded by Regina Eck, CNM on 12/01/2016 at 8:10 AM EDT Notify patient that her Concow shows very low range fertility unlikely. How is she doing with Lexapro use. She was interested in HRT, how are the hot flashes?

## 2016-12-03 NOTE — Telephone Encounter (Signed)
Left message to call Dylin Ihnen at [phone]. 

## 2016-12-03 NOTE — Therapy (Signed)
[place], [place], [address] Phone: [phone]   Fax:  [phone]  Physical Therapy Treatment  Patient Details  Name: Gabriela Figueroa MRN: [id number] Date of Birth: Feb 08, 1966 Referring Provider: Dr Leanora Cover  Encounter Date: 12/03/2016      PT End of Session - 12/03/16 1154    Visit Number 2   Number of Visits 12   Date for PT Re-Evaluation 01/07/17   PT Start Time 5170   PT Stop Time 1250   PT Time Calculation (min) 61 min   Activity Tolerance Patient tolerated treatment well      Past Medical History:  Diagnosis Date  . Abnormal pap 09/01   CIN1, II HR HPV  . Depression   . Dyspareunia 03/04   PUS (neg)  . Echocardiogram abnormal about 1998   physiological mitral & tricuspid regurge, but no MVP, No SBE prophylaxis  . GERD (gastroesophageal reflux disease) 10/04  . H/O tension headache 09/01   family stressors  . HSV (herpes simplex virus) anogenital infection hx  . IBS (irritable bowel syndrome)   . IC (interstitial cystitis) 11/2002   Dr. Amalia Hailey  . Rotator cuff injury    partial tear left arm  . STD (sexually transmitted disease) 11/30/99, 12/06   HSV, 12/06  . TMJ (temporomandibular joint syndrome) 2004    Past Surgical History:  Procedure Laterality Date  . AUGMENTATION MAMMAPLASTY  01/08   reconstruction  . COLPOSCOPY  10/01   no lesion  . FOOT SURGERY  2012   right   . KNEE ARTHROPLASTY  1998   left knee reconstruction  . VAGINA SURGERY  1982   rectal/vaginal repair post partum    There were no vitals filed for this visit.      Subjective Assessment - 12/03/16 1155    Subjective Juliann Pulse reports some difficulty with finding a time and place to do the lat stretch - no difference in how the shoulder feels - still has pain with reaching in and down or motioins such as steering the car. (IR)    Currently in Pain? No/denies   Pain Score --  pain with IR motions     Pain Location Shoulder   Pain Orientation Left;Anterior;Posterior   Pain Descriptors / Indicators Dull   Pain Type Chronic pain   Pain Onset More than a month ago   Pain Frequency Intermittent                         [facility] Adult PT Treatment/Exercise - 12/03/16 0001      Shoulder Exercises: Stretch   Other Shoulder Stretches supine hips and knees bent; palm toward face bringing Lt UE up over head 30 sec x 3      Moist Heat Therapy   Number Minutes Moist Heat 20 Minutes   Moist Heat Location Shoulder;Cervical  Lt     Electrical Stimulation   Electrical Stimulation Location Lt shoulder/trap   Electrical Stimulation Action IFC   Electrical Stimulation Parameters to tolerance   Electrical Stimulation Goals Pain;Tone     Manual Therapy   Manual therapy comments pt prone and supine    Joint Mobilization Lt GH joint circumduction    Soft tissue mobilization posterior shoudler through lats/teres - Lt lats through low back area    Passive ROM Lt shoulder IR/flexion           Trigger Point Dry Needling - 12/03/16 1231  Consent Given? Yes   Education Handout Provided Yes   Muscles Treated Upper Body --  Lt posterior shoulder lats/teres - dec palpable tightness              PT Education - 12/03/16 1233    Education provided Yes   Education Details DN   Person(s) Educated Patient   Methods Explanation   Comprehension Verbalized understanding             PT Long Term Goals - 12/03/16 1155      PT LONG TERM GOAL #1   Title Decrease Lt shoulder pain with patient to report 50-75% reduction in pain 01/07/17   Time 6   Period Weeks   Status On-going     PT LONG TERM GOAL #2   Title Pan free resisted shoulder extension and IR 01/07/17   Time 6   Period Weeks   Status On-going     PT LONG TERM GOAL #3   Title Return to normal functional activities such as vacuuming without pain and limitation 01/07/17   Time 6   Period Weeks   Status  On-going     PT LONG TERM GOAL #4   Title Independent in HEP 01/07/17   Time 6   Period Weeks   Status On-going     PT LONG TERM GOAL #5   Title Improve FOTO to </= 35% limitation 01/07/17   Time 6   Period Weeks   Status On-going               Plan - 12/03/16 1233    Clinical Impression Statement No significant change in shoulder pain. No pai nat rest but pt has sharp pain with IR activities such as turning her steering wheel while driving or reaching down and back. She tolerated DN fair with good tissue release noted through the Lt teres/lats/subscap area. She notable tightnes through the lats from crest of pelvis through low back area Lt > Rt with pain on Lt produced with palpation through the Rt lats.    Rehab Potential Good   PT Frequency 2x / week   PT Duration 6 weeks   PT Treatment/Interventions Patient/family education;ADLs/Self Care Home Management;Cryotherapy;Electrical Stimulation;Iontophoresis 4mg /ml Dexamethasone;Moist Heat;Ultrasound;Dry needling;Manual techniques;Therapeutic activities;Therapeutic exercise;Neuromuscular re-education   PT Next Visit Plan try prayer lat stretch and try to progress stretch; manual work through [employer]; modalities as indicated; assess response to DN Further assessment of dizziness as indicated    Consulted and Agree with Plan of Care Patient      Patient will benefit from skilled therapeutic intervention in order to improve the following deficits and impairments:  Postural dysfunction, Improper body mechanics, Increased fascial restricitons, Increased muscle spasms, Pain, Decreased activity tolerance  Visit Diagnosis: Acute pain of left shoulder  Other symptoms and signs involving the musculoskeletal system     Problem List There are no active problems to display for this patient.   [place], MPH  12/03/2016, 12:38 PM  [facility] [place], [place], [address] Phone: [phone]   Fax:  [phone]  Name: Avani Sensabaugh MRN: [id number] Date of Birth: 1965-10-17

## 2016-12-03 NOTE — Patient Instructions (Signed)

## 2016-12-08 ENCOUNTER — Encounter: Payer: Self-pay | Admitting: Certified Nurse Midwife

## 2016-12-09 ENCOUNTER — Ambulatory Visit (INDEPENDENT_AMBULATORY_CARE_PROVIDER_SITE_OTHER): Payer: BLUE CROSS/BLUE SHIELD | Admitting: Rehabilitative and Restorative Service Providers"

## 2016-12-09 ENCOUNTER — Encounter: Payer: Self-pay | Admitting: Rehabilitative and Restorative Service Providers"

## 2016-12-09 DIAGNOSIS — R29898 Other symptoms and signs involving the musculoskeletal system: Secondary | ICD-10-CM

## 2016-12-09 DIAGNOSIS — M25512 Pain in left shoulder: Secondary | ICD-10-CM

## 2016-12-09 NOTE — Therapy (Signed)
[facility] [place], [place], [address] Phone: [phone]   Fax:  [phone]  Physical Therapy Treatment  Patient Details  Name: Gabriela Figueroa MRN: [id number] Date of Birth: Sep 15, 1965 Referring Provider: Dr Leanora Cover  Encounter Date: 12/09/2016      PT End of Session - 12/09/16 0936    Visit Number 3   Number of Visits 12   Date for PT Re-Evaluation 01/07/17   PT Start Time 0935   PT Stop Time 1026   PT Time Calculation (min) 51 min   Activity Tolerance Patient tolerated treatment well      Past Medical History:  Diagnosis Date  . Abnormal pap 09/01   CIN1, II HR HPV  . Depression   . Dyspareunia 03/04   PUS (neg)  . Echocardiogram abnormal about 1998   physiological mitral & tricuspid regurge, but no MVP, No SBE prophylaxis  . GERD (gastroesophageal reflux disease) 10/04  . H/O tension headache 09/01   family stressors  . HSV (herpes simplex virus) anogenital infection hx  . IBS (irritable bowel syndrome)   . IC (interstitial cystitis) 11/2002   Dr. Amalia Hailey  . Rotator cuff injury    partial tear left arm  . STD (sexually transmitted disease) 11/30/99, 12/06   HSV, 12/06  . TMJ (temporomandibular joint syndrome) 2004    Past Surgical History:  Procedure Laterality Date  . AUGMENTATION MAMMAPLASTY  01/08   reconstruction  . COLPOSCOPY  10/01   no lesion  . FOOT SURGERY  2012   right   . KNEE ARTHROPLASTY  1998   left knee reconstruction  . VAGINA SURGERY  1982   rectal/vaginal repair post partum    There were no vitals filed for this visit.      Subjective Assessment - 12/09/16 0937    Subjective Very sore the day after therapy but also helped move her daughter which may have contributed to soreness. Then had 3-4 really good days. Sleeping better but awoke a couple of nights ago with pain in the front shoulder upper arm area.    Currently in Pain? No/denies                          [facility] Adult PT Treatment/Exercise - 12/09/16 0001      Therapeutic Activites    Therapeutic Activities --  myofacial ball release work pt Lt SL arm extended      Shoulder Exercises: Stretch   Other Shoulder Stretches supine hips and knees bent; palm toward face bringing Lt UE up over head 30 sec x 3      Moist Heat Therapy   Number Minutes Moist Heat 20 Minutes   Moist Heat Location Shoulder;Cervical  Lt     Electrical Stimulation   Electrical Stimulation Location Lt shoulder/trap   Electrical Stimulation Action IFC   Electrical Stimulation Parameters to tolerance    Electrical Stimulation Goals Pain;Tone     Manual Therapy   Manual therapy comments pt prone and supine    Joint Mobilization Lt GH joint circumduction    Soft tissue mobilization posterior shoudler through lats/teres - Lt lats through low back area    Passive ROM Lt shoulder IR/flexion           Trigger Point Dry Needling - 12/09/16 1003    Consent Given? Yes   Muscles Treated Upper Body --  teres/lats Lt x 4 DN  PT Education - 12/09/16 1004    Education provided Yes   Education Details myofacial ball release Lt sidelying    Person(s) Educated Patient   Methods Explanation;Tactile cues;Verbal cues   Comprehension Verbalized understanding;Returned demonstration;Verbal cues required;Tactile cues required             PT Long Term Goals - 12/09/16 0937      PT LONG TERM GOAL #1   Title Decrease Lt shoulder pain with patient to report 50-75% reduction in pain 01/07/17   Time 6   Period Weeks   Status On-going     PT LONG TERM GOAL #2   Title Pan free resisted shoulder extension and IR 01/07/17   Time 6   Period Weeks   Status On-going     PT LONG TERM GOAL #3   Title Return to normal functional activities such as vacuuming without pain and limitation 01/07/17   Time 6   Period Weeks   Status On-going     PT LONG TERM GOAL #4    Title Independent in HEP 01/07/17   Time 6   Period Weeks   Status On-going     PT LONG TERM GOAL #5   Title Improve FOTO to </= 35% limitation 01/07/17   Time 6   Period Weeks   Status On-going               Plan - 12/09/16 1005    Clinical Impression Statement Some improvement in shoulder symptoms after the initial soreness from DN subsided. Patient had more difficulty tolerating DN today. Responded well to manual work and myofacial ball release followed by modalities.    Rehab Potential Good   PT Frequency 2x / week   PT Duration 6 weeks   PT Treatment/Interventions Patient/family education;ADLs/Self Care Home Management;Cryotherapy;Electrical Stimulation;Iontophoresis 4mg /ml Dexamethasone;Moist Heat;Ultrasound;Dry needling;Manual techniques;Therapeutic activities;Therapeutic exercise;Neuromuscular re-education   PT Next Visit Plan try prayer lat stretch and try to progress stretch; manual work through [employer]; modalities as indicated; assess response to DN Further assessment of dizziness as indicated    Consulted and Agree with Plan of Care Patient      Patient will benefit from skilled therapeutic intervention in order to improve the following deficits and impairments:  Postural dysfunction, Improper body mechanics, Increased fascial restricitons, Increased muscle spasms, Pain, Decreased activity tolerance  Visit Diagnosis: Acute pain of left shoulder  Other symptoms and signs involving the musculoskeletal system     Problem List There are no active problems to display for this patient.   [place], MPH  12/09/2016, 10:17 AM  [facility] [place], [place], [address] Phone: [phone]   Fax:  [phone]  Name: Lynelle Weiler MRN: [id number] Date of Birth: 1965/11/19

## 2016-12-14 ENCOUNTER — Encounter: Payer: BLUE CROSS/BLUE SHIELD | Admitting: Rehabilitative and Restorative Service Providers"

## 2016-12-15 ENCOUNTER — Encounter: Payer: Self-pay | Admitting: Rehabilitative and Restorative Service Providers"

## 2016-12-15 ENCOUNTER — Ambulatory Visit (INDEPENDENT_AMBULATORY_CARE_PROVIDER_SITE_OTHER): Payer: BLUE CROSS/BLUE SHIELD | Admitting: Rehabilitative and Restorative Service Providers"

## 2016-12-15 DIAGNOSIS — M25512 Pain in left shoulder: Secondary | ICD-10-CM

## 2016-12-15 DIAGNOSIS — R29898 Other symptoms and signs involving the musculoskeletal system: Secondary | ICD-10-CM

## 2016-12-15 NOTE — Patient Instructions (Signed)
Scapula Adduction With Pectoralis Stretch: Low - Standing   Shoulders at 45 hands even with shoulders, keeping weight through legs, shift weight forward until you feel pull or stretch through the front of your chest. Hold _30__ seconds. Do _3__ times, _2-4__ times per day.   Scapula Adduction With Pectoralis Stretch: Mid-Range - Standing   Shoulders at 90 elbows even with shoulders, keeping weight through legs, shift weight forward until you feel pull or strength through the front of your chest. Hold __30_ seconds. Do _3__ times, __2-4_ times per day.   Scapula Adduction With Pectoralis Stretch: High - Standing   Shoulders at 120 hands up high on the doorway, keeping weight on feet, shift weight forward until you feel pull or stretch through the front of your chest. Hold _30__ seconds. Do _3__ times, _2-3__ times per day.  

## 2016-12-15 NOTE — Therapy (Signed)
[place], [place], [address] Phone: [phone]   Fax:  [phone]  Physical Therapy Treatment  Patient Details  Name: Gabriela Figueroa MRN: [id number] Date of Birth: [birth year] Referring Provider: Dr Leanora Cover  Encounter Date: 12/15/2016      PT End of Session - 12/15/16 1019    Visit Number 4   Number of Visits 12   Date for PT Re-Evaluation 01/07/17   PT Start Time 1017   PT Stop Time 1109   PT Time Calculation (min) 52 min   Activity Tolerance Patient tolerated treatment well      Past Medical History:  Diagnosis Date  . Abnormal pap 09/01   CIN1, II HR HPV  . Depression   . Dyspareunia 03/04   PUS (neg)  . Echocardiogram abnormal about 1998   physiological mitral & tricuspid regurge, but no MVP, No SBE prophylaxis  . GERD (gastroesophageal reflux disease) 10/04  . H/O tension headache 09/01   family stressors  . HSV (herpes simplex virus) anogenital infection hx  . IBS (irritable bowel syndrome)   . IC (interstitial cystitis) 11/2002   Dr. Amalia Hailey  . Rotator cuff injury    partial tear left arm  . STD (sexually transmitted disease) 11/30/99, 12/06   HSV, 12/06  . TMJ (temporomandibular joint syndrome) 2004    Past Surgical History:  Procedure Laterality Date  . AUGMENTATION MAMMAPLASTY  01/08   reconstruction  . COLPOSCOPY  10/01   no lesion  . FOOT SURGERY  2012   right   . KNEE ARTHROPLASTY  1998   left knee reconstruction  . VAGINA SURGERY  1982   rectal/vaginal repair post partum    There were no vitals filed for this visit.      Subjective Assessment - 12/15/16 1022    Subjective Cotninued sharp pain in the Lt shoulder with internal rotation. Now having similar but less intenst pain in the Rt shoulder.    Currently in Pain? Yes   Pain Score 5    Pain Location Shoulder   Pain Orientation Left;Anterior;Posterior   Pain Descriptors / Indicators Dull   sharp pain with certain movements    Pain Type Chronic pain   Pain Onset More than a month ago   Pain Frequency Intermittent                         [facility] Adult PT Treatment/Exercise - 12/15/16 0001      Shoulder Exercises: Stretch   Other Shoulder Stretches pec stretch in doorway 30 sec x 2 each position; snow angel arms ~ 80 deg x 3 min      Moist Heat Therapy   Number Minutes Moist Heat 20 Minutes   Moist Heat Location Shoulder;Cervical  Lt     Electrical Stimulation   Electrical Stimulation Location Lt shoulder/trap   Electrical Stimulation Action IFC   Electrical Stimulation Parameters to tolerance   Electrical Stimulation Goals Pain;Tone     Manual Therapy   Manual therapy comments pt supine    Joint Mobilization Lt GH joint circumduction    Soft tissue mobilization working anteriorly through the pecs; anterior deltoid; upper trap; leveator Rt > Lt    Passive ROM Lt/Rt shoulders in all planes Lt - flexion and ER Rt                 PT Education - 12/15/16 1046    Education provided  Yes   Education Details HEP   Person(s) Educated Patient   Methods Explanation;Demonstration;Tactile cues;Verbal cues;Handout   Comprehension Verbalized understanding;Returned demonstration;Verbal cues required;Tactile cues required             PT Long Term Goals - 12/15/16 1020      PT LONG TERM GOAL #1   Title Decrease Lt shoulder pain with patient to report 50-75% reduction in pain 01/07/17   Time 6   Period Weeks   Status On-going     PT LONG TERM GOAL #2   Title Pan free resisted shoulder extension and IR 01/07/17   Time 6   Period Weeks   Status On-going     PT LONG TERM GOAL #3   Title Return to normal functional activities such as vacuuming without pain and limitation 01/07/17   Time 6   Period Weeks   Status On-going     PT LONG TERM GOAL #4   Title Independent in HEP 01/07/17   Time 6   Period Weeks   Status On-going     PT LONG  TERM GOAL #5   Title Improve FOTO to </= 35% limitation 01/07/17   Time 6   Period Weeks   Status On-going               Plan - 12/15/16 1023    Clinical Impression Statement Persistent pain with IR from horizontal abducted position. Continued muscular tightness to palpation. Up and down course of treatment with episodic flare ups. Note tightness through the pecs bilat - Lt > Rt. Patient has not been stretching pecs and will add the doorway and wall stretch for home.    Rehab Potential Good   PT Frequency 2x / week   PT Duration 6 weeks   PT Treatment/Interventions Patient/family education;ADLs/Self Care Home Management;Cryotherapy;Electrical Stimulation;Iontophoresis 4mg /ml Dexamethasone;Moist Heat;Ultrasound;Dry needling;Manual techniques;Therapeutic activities;Therapeutic exercise;Neuromuscular re-education      Patient will benefit from skilled therapeutic intervention in order to improve the following deficits and impairments:  Postural dysfunction, Improper body mechanics, Increased fascial restricitons, Increased muscle spasms, Pain, Decreased activity tolerance  Visit Diagnosis: Acute pain of left shoulder  Other symptoms and signs involving the musculoskeletal system     Problem List There are no active problems to display for this patient.   [place], MPH  12/15/2016, 11:04 AM  [facility] [place], [place], [address] Phone: [phone]   Fax:  [phone]  Name: Gabriela Figueroa MRN: [id number] Date of Birth: 1965-05-26

## 2016-12-21 ENCOUNTER — Encounter: Payer: Self-pay | Admitting: Rehabilitative and Restorative Service Providers"

## 2016-12-21 ENCOUNTER — Ambulatory Visit: Payer: BLUE CROSS/BLUE SHIELD | Admitting: Rehabilitative and Restorative Service Providers"

## 2016-12-21 DIAGNOSIS — R29898 Other symptoms and signs involving the musculoskeletal system: Secondary | ICD-10-CM | POA: Diagnosis not present

## 2016-12-21 DIAGNOSIS — M25512 Pain in left shoulder: Secondary | ICD-10-CM

## 2016-12-21 NOTE — Patient Instructions (Addendum)
Internal Rotator Cuff Stretch, Standing    Stand holding strap or dog leash behind body, one arm above head, other arm bent behind back. With upper hand, pull gently upward. Hold __30_ seconds. Repeat _3__ times per session. Do _2-3__ sessions per day.

## 2016-12-21 NOTE — Therapy (Addendum)
[place], [place], [address] Phone: [phone]   Fax:  [phone]  Physical Therapy Treatment  Patient Details  Name: Gabriela Figueroa MRN: [id number] Date of Birth: December 12, 1965 Referring Provider: Dr Leanora Cover   Encounter Date: 12/21/2016  PT End of Session - 12/21/16 0942    Visit Number  5    Number of Visits  12    Date for PT Re-Evaluation  01/07/17    PT Start Time  0933    PT Stop Time  1019    PT Time Calculation (min)  46 min    Activity Tolerance  Patient tolerated treatment well       Past Medical History:  Diagnosis Date  . Abnormal pap 09/01   CIN1, II HR HPV  . Depression   . Dyspareunia 03/04   PUS (neg)  . Echocardiogram abnormal about 1998   physiological mitral & tricuspid regurge, but no MVP, No SBE prophylaxis  . GERD (gastroesophageal reflux disease) 10/04  . H/O tension headache 09/01   family stressors  . HSV (herpes simplex virus) anogenital infection hx  . IBS (irritable bowel syndrome)   . IC (interstitial cystitis) 11/2002   Dr. Amalia Hailey  . Rotator cuff injury    partial tear left arm  . STD (sexually transmitted disease) 11/30/99, 12/06   HSV, 12/06  . TMJ (temporomandibular joint syndrome) 2004    Past Surgical History:  Procedure Laterality Date  . AUGMENTATION MAMMAPLASTY  01/08   reconstruction  . COLPOSCOPY  10/01   no lesion  . FOOT SURGERY  2012   right   . KNEE ARTHROPLASTY  1998   left knee reconstruction  . VAGINA SURGERY  1982   rectal/vaginal repair post partum    There were no vitals filed for this visit.  Subjective Assessment - 12/21/16 0943    Subjective  Continued sharp pain in the Lt shoulder with internal rotation. Now having similar but less intenst pain in the Rt shoulder. Patient feels that she has some improvement in shoulder pain with therapy but things gradually tighten back up again after a few days.     Currently in  Pain?  Yes    Pain Score  4     Pain Location  Shoulder    Pain Orientation  Left;Anterior;Posterior    Pain Descriptors / Indicators  Dull    Pain Type  Chronic pain    Pain Onset  More than a month ago    Pain Frequency  Intermittent                      [facility] Adult PT Treatment/Exercise - 12/21/16 0001      Shoulder Exercises: Stretch   Internal Rotation Stretch  2 reps 30 sec with strap    30 sec with strap      Moist Heat Therapy   Number Minutes Moist Heat  20 Minutes    Moist Heat Location  Shoulder;Cervical Lt   Lt     Electrical Stimulation   Electrical Stimulation Location  Lt shoulder/trap    Electrical Stimulation Action  IFC    Electrical Stimulation Parameters  to tolerance    Electrical Stimulation Goals  Pain;Tone      Manual Therapy   Manual therapy comments  pt supine     Joint Mobilization  Lt GH joint circumduction     Soft tissue mobilization  working anteriorly through the pecs;  anterior deltoid; upper trap; leveator Rt > Lt     Passive ROM  Lt/Rt shoulders in all planes Lt - flexion and ER Rt              PT Education - 12/21/16 1026    Education provided  Yes    Education Details  HEP     Person(s) Educated  Patient    Methods  Explanation;Demonstration;Tactile cues;Verbal cues;Handout    Comprehension  Verbalized understanding;Returned demonstration;Verbal cues required;Tactile cues required          PT Long Term Goals - 12/21/16 1004      PT LONG TERM GOAL #1   Title  Decrease Lt shoulder pain with patient to report 50-75% reduction in pain 01/07/17    Time  6    Period  Weeks    Status  On-going      PT LONG TERM GOAL #2   Title  Pan free resisted shoulder extension and IR 01/07/17    Time  6    Period  Weeks    Status  On-going      PT LONG TERM GOAL #3   Title  Return to normal functional activities such as vacuuming without pain and limitation 01/07/17    Time  6    Period  Weeks    Status  On-going       PT LONG TERM GOAL #4   Title  Independent in HEP 01/07/17    Time  6    Period  Weeks    Status  On-going      PT LONG TERM GOAL #5   Title  Improve FOTO to </= 35% limitation 01/07/17    Time  6    Period  Weeks    Status  On-going            Plan - 12/21/16 1002    Clinical Impression Statement  Pain continues through the posterior shoulder and anteriorly through the pecs. Functionally patient has pain in the Lt arm with IR. She has limited IR arm up back and tenderness to palpation through the lateral border of the scapula into the inferior scapula in the subscap and lats. Discussed possibility of appt with orthopedist for additional diagnostic testing     Rehab Potential  Good    PT Frequency  2x / week    PT Duration  6 weeks    PT Treatment/Interventions  Patient/family education;ADLs/Self Care Home Management;Cryotherapy;Electrical Stimulation;Iontophoresis 13m/ml Dexamethasone;Moist Heat;Ultrasound;Dry needling;Manual techniques;Therapeutic activities;Therapeutic exercise;Neuromuscular re-education    PT Next Visit Plan  try prayer lat stretch and try to progress stretch; manual work through [employer] modalities as indicated; DN as indicated     Consulted and Agree with Plan of Care  Patient       Patient will benefit from skilled therapeutic intervention in order to improve the following deficits and impairments:  Postural dysfunction, Improper body mechanics, Increased fascial restricitons, Increased muscle spasms, Pain, Decreased activity tolerance  Visit Diagnosis: Acute pain of left shoulder  Other symptoms and signs involving the musculoskeletal system     Problem List There are no active problems to display for this patient.   C[place]ley SpringsPt, MPH  12/21/2016, 10:26 AM  [facility] [address]Phone: [phone]  Fax:  [phone] Name: KEsmay AmspacherMRN: [id number] of Birth: [birth year]  PHYSICAL THERAPY DISCHARGE SUMMARY  Visits from Start of  Care: 5  Current functional level related to goals / functional outcomes: See last progress note for discharge status.   Remaining deficits: Unknown    Education / Equipment: HEP  Plan: Patient agrees to discharge.  Patient goals were not met. Patient is being discharged due to not returning since the last visit.  ?????     Haze Ant[place] P. Helene Kelp PT, MPH 01/18/17 10:31 AM

## 2016-12-31 ENCOUNTER — Encounter: Payer: BLUE CROSS/BLUE SHIELD | Admitting: Rehabilitative and Restorative Service Providers"

## 2017-02-15 HISTORY — PX: ROTATOR CUFF REPAIR: SHX139

## 2017-02-21 ENCOUNTER — Other Ambulatory Visit: Payer: Self-pay | Admitting: *Deleted

## 2017-02-21 DIAGNOSIS — N951 Menopausal and female climacteric states: Secondary | ICD-10-CM

## 2017-02-21 NOTE — Telephone Encounter (Signed)
Medication refill request: prempro  Last OV: 10/07/16 DL  Next OV: 03/08/17  Last MMG (if hormonal medication request): 11/18/16 BIRADS 1 negative  Refill authorized: 12/02/16 #30, 2RF. Today, please advise.

## 2017-02-22 MED ORDER — CONJ ESTROG-MEDROXYPROGEST ACE 0.3-1.5 MG PO TABS: 1.0000 | ORAL_TABLET | Freq: Every day | ORAL | 0 refills | Status: DC

## 2017-03-08 ENCOUNTER — Other Ambulatory Visit: Payer: Self-pay

## 2017-03-08 ENCOUNTER — Encounter: Payer: Self-pay | Admitting: Certified Nurse Midwife

## 2017-03-08 ENCOUNTER — Ambulatory Visit: Payer: BLUE CROSS/BLUE SHIELD | Admitting: Certified Nurse Midwife

## 2017-03-08 VITALS — BP 116/68 | HR 68 | Resp 16 | Ht 64.0 in | Wt 157.0 lb

## 2017-03-08 DIAGNOSIS — Z79890 Hormone replacement therapy (postmenopausal): Secondary | ICD-10-CM

## 2017-03-08 DIAGNOSIS — N951 Menopausal and female climacteric states: Secondary | ICD-10-CM | POA: Diagnosis not present

## 2017-03-08 DIAGNOSIS — F329 Major depressive disorder, single episode, unspecified: Secondary | ICD-10-CM

## 2017-03-08 DIAGNOSIS — N898 Other specified noninflammatory disorders of vagina: Secondary | ICD-10-CM

## 2017-03-08 DIAGNOSIS — F419 Anxiety disorder, unspecified: Secondary | ICD-10-CM | POA: Diagnosis not present

## 2017-03-08 DIAGNOSIS — F32A Depression, unspecified: Secondary | ICD-10-CM

## 2017-03-08 MED ORDER — CONJ ESTROG-MEDROXYPROGEST ACE 0.3-1.5 MG PO TABS: 1.0000 | ORAL_TABLET | Freq: Every day | ORAL | 4 refills | Status: DC

## 2017-03-08 MED ORDER — ESCITALOPRAM OXALATE 10 MG PO TABS: 10.0000 mg | ORAL_TABLET | Freq: Every day | ORAL | 3 refills | Status: DC

## 2017-03-08 NOTE — Progress Notes (Signed)
52 y.o. Married Caucasian female [phone] here with complaint of vaginal symptoms of itching,  and increase discharge. Describes discharge as white, milky, no odor. Onset of symptoms 3 days ago. Denies new personal products or vaginal dryness. Patient was treated for Sinus infection with antibiotics Amoxicillin and was given one Diflucan 4 days ago and now having increase discharge and itching. Also has surgery for rotator cuff repair on left shoulder in one week and does not what this to continue..  Contraception is Vasectomy. Menopausal. Also here for medication evaluation of Prempro for menopausal symptoms relief and Lexapro for anxiety. Patient taking medication as directed, denies any warning signs, or vaginal bleeding. Hot flashes and night sweats have decreased in amount and intensity. Sleep pattern improved. Happy with choice. Lexapro 1/2 tablet is working well for anxiety, denies any panic attacks or side effects. No thoughts of hurting self or others. Desires continuance of them both. No other health issues today.  Past Medical History:  Diagnosis Date  . Abnormal pap 09/01   CIN1, II HR HPV  . Depression   . Dyspareunia 03/04   PUS (neg)  . Echocardiogram abnormal about 1998   physiological mitral & tricuspid regurge, but no MVP, No SBE prophylaxis  . GERD (gastroesophageal reflux disease) 10/04  . H/O tension headache 09/01   family stressors  . HSV (herpes simplex virus) anogenital infection hx  . IBS (irritable bowel syndrome)   . IC (interstitial cystitis) 11/2002   Dr. Amalia Hailey  . Rotator cuff injury    partial tear left arm  . STD (sexually transmitted disease) 11/30/99, 12/06   HSV, 12/06  . TMJ (temporomandibular joint syndrome) 2004    Past Surgical History:  Procedure Laterality Date  . AUGMENTATION MAMMAPLASTY  01/08   reconstruction  . COLPOSCOPY  10/01   no lesion  . FOOT SURGERY  2012   right   . KNEE ARTHROPLASTY  1998   left knee reconstruction  . VAGINA  SURGERY  1982   rectal/vaginal repair post partum     Current Outpatient Medications:  .  calcium carbonate (OS-CAL) 600 MG TABS, Take 600 mg by mouth 2 (two) times daily with a meal., Disp: , Rfl:  .  escitalopram (LEXAPRO) 10 MG tablet, TAKE 1 TABLET BY MOUTH EVERY DAY, Disp: 30 tablet, Rfl: 1 .  estrogen, conjugated,-medroxyprogesterone (PREMPRO) 0.3-1.5 MG tablet, Take 1 tablet by mouth daily., Disp: 30 tablet, Rfl: 0 .  EVENING PRIMROSE OIL PO, Take by mouth. Take 1300mg  once daily, Disp: , Rfl:  .  Hyoscyamine Sulfate (LEVBID PO), Take 0.375 mg by mouth daily. Takes 1 tablet, Disp: , Rfl:  .  latanoprost (XALATAN) 0.005 % ophthalmic solution, Place 1 drop into both eyes at bedtime. , Disp: , Rfl:  .  Multiple Vitamins-Minerals (MULTIVITAMIN PO), Take by mouth daily., Disp: , Rfl:  .  Omega-3 Fatty Acids (FISH OIL) 1000 MG CAPS, Take by mouth daily., Disp: , Rfl:  .  omeprazole (PRILOSEC) 20 MG capsule, Take 20 mg by mouth daily., Disp: , Rfl:  .  S-Adenosylmethionine (SAM-E PO), Take 200 mg by mouth daily. , Disp: , Rfl:  .  valACYclovir (VALTREX) 500 MG tablet, TAKE 1 TABLET BY MOUTH TWICE A DAY, Disp: 60 tablet, Rfl: 8  ALLERGIES: Patient has no known allergies.  O:  Healthy female WDWN Affect: normal, orientation x 3  Physical Exam:  BP 116/68 (BP Location: Right Arm, Patient Position: Sitting, Cuff Size: Normal)   Pulse 68  Resp 16   Ht 5\' 4"  (1.626 m)   Wt 157 lb (71.2 kg)   BMI 26.95 kg/m  General appearance: alert, cooperative and appears stated age Abdomen: Lymph node: no enlargement or tenderness Pelvic exam: External genital: normal female, no lesions or atrophy noted or scaling or exudate BUS: negative Vagina: white odorous  discharge noted.  Affirm taken. Cervix: normal, non tender, no CMT Uterus: normal, non tender Adnexa:normal, non tender, no masses or fullness noted    A: Menopausal on HRT working well, no contraindications to use  Anxiety Lexapro  working well without any side effects, desires continuance  Normal pelvic exam  R/O vaginal infection  Rotator cuff repair Left shoulder scheduled in one week   P: Discussed risks/benefits/side effects of Prempro and Lexapro.  Patient desires continuance.  Rx Prempro see order with instructions  Rx Lexapro see order with instructions  Discussed recommend being off Prempro a week prior to surgery due risk of DVT. Patient was not aware this was a concern and will check with Orthopedic, but will stop if recommended.. Aware symptoms may return.   Discussed normal pelvic exam findings and suspicious of antibiotic induced yeast, but had treated so will await affirm to treat, if indicated. . Discussed Aveeno or baking soda sitz bath for comfort. Avoid moist clothes or pads for extended period of time.  Olive Oil/Coconut Oil use for skin protection prior to activity can be used to external skin.  Lab: Affim  Follow with Affirm results  Rv prn

## 2017-03-08 NOTE — Patient Instructions (Signed)

## 2017-03-09 ENCOUNTER — Other Ambulatory Visit: Payer: Self-pay

## 2017-03-09 LAB — VAGINITIS/VAGINOSIS, DNA PROBE
Candida Species: NEGATIVE
Gardnerella vaginalis: POSITIVE — AB
Trichomonas vaginosis: NEGATIVE

## 2017-03-09 MED ORDER — METRONIDAZOLE 0.75 % VA GEL: VAGINAL | 0 refills | Status: DC

## 2017-03-09 NOTE — Telephone Encounter (Signed)
Pt notified of result & rx sent to [facility] per patient request.

## 2017-03-09 NOTE — Telephone Encounter (Signed)
lmtcb

## 2017-03-09 NOTE — Telephone Encounter (Signed)
-----   Message from Regina Eck, CNM sent at 03/09/2017  7:38 AM EST ----- Notify patient her vaginal screen was positive for BV, negative for yeast and trichomonas Will need Rx Metrogel bid x 5 days vaginally

## 2017-03-09 NOTE — Telephone Encounter (Signed)
Patient returning Joy's call.  °

## 2017-03-16 ENCOUNTER — Telehealth: Payer: Self-pay | Admitting: Certified Nurse Midwife

## 2017-03-16 MED ORDER — FLUCONAZOLE 150 MG PO TABS: ORAL_TABLET | ORAL | 0 refills | Status: DC

## 2017-03-16 NOTE — Telephone Encounter (Signed)
Spoke with patient. Patient states that she was treated for BV with Metrogel after her appointment on 03/08/2017 with Melvia Heaps CNM. On 03/14/2017 she had shoulder surgery and was given antibiotics. Now is having vaginal itching and thick white discharge. Is unable to drive to be seen for an appointment. Asking if rx for Diflucan 150 mg can be [facility] to [facility] [place]. Advised will review with Melvia Heaps CNM and return call.

## 2017-03-16 NOTE — Telephone Encounter (Signed)
Spoke with patient, advised as seen below per Melvia Heaps, CNM. Rx for Diflucan to verified pharmacy on file. Advised to take one tab now, may repeat in 72 hours if symptoms still present. Patient thankful, verbalizes understanding and is agreeable.   Routing to provider for final review. Patient is agreeable to disposition. Will close encounter.

## 2017-03-16 NOTE — Telephone Encounter (Signed)
Patient recently has surgery on her shoulder 03/14/17 and now has a yeast infection. Patient is unable to drive and is asking if a prescription could be called to her [place], [place]

## 2017-03-16 NOTE — Telephone Encounter (Signed)
Ok to do Diflucan

## 2017-03-24 ENCOUNTER — Telehealth: Payer: Self-pay | Admitting: Certified Nurse Midwife

## 2017-03-24 NOTE — Telephone Encounter (Signed)
Spoke with patient, advised as seen below per Melvia Heaps, CNM. Patient verbalizes understanding and is agreeable. Will close encounter.

## 2017-03-24 NOTE — Telephone Encounter (Signed)
Spoke with patient. Patient requesting RX for "bacterial infection". Reports thick, white, "cottage cheese" vaginal d/c with no odor. Started 3-4 days ago.   Recently had shoulder surgery, unable to drive for 2 more weeks, lives an hr away.   Treated for BV on 03/09/17. Diflucan prescribed on 03/16/17.   Advised patient OV needed for further evaluation. Patient declined OV offered for 03/25/17. Patient states she will be in [place] on Monday, 2/11 for surgery f/u, can schedule with any provider after 11am. Advised patient will review scheduling with covering providers and return call.  Melvia Heaps, CNM -please review, any additional recommendations?

## 2017-03-24 NOTE — Telephone Encounter (Signed)
Patient is having symptoms she would like to discuss with nurse.

## 2017-03-24 NOTE — Telephone Encounter (Signed)
She can do Monistat OTC and to see if this resolves the symptoms and may not need OV

## 2017-04-28 ENCOUNTER — Other Ambulatory Visit: Payer: Self-pay

## 2017-04-28 ENCOUNTER — Encounter: Payer: Self-pay | Admitting: Certified Nurse Midwife

## 2017-04-28 ENCOUNTER — Ambulatory Visit: Payer: BLUE CROSS/BLUE SHIELD | Admitting: Certified Nurse Midwife

## 2017-04-28 VITALS — BP 122/80 | HR 70 | Resp 16 | Ht 64.0 in | Wt 159.0 lb

## 2017-04-28 DIAGNOSIS — Z01419 Encounter for gynecological examination (general) (routine) without abnormal findings: Secondary | ICD-10-CM

## 2017-04-28 DIAGNOSIS — N898 Other specified noninflammatory disorders of vagina: Secondary | ICD-10-CM | POA: Diagnosis not present

## 2017-04-28 DIAGNOSIS — Z113 Encounter for screening for infections with a predominantly sexual mode of transmission: Secondary | ICD-10-CM

## 2017-04-28 NOTE — Progress Notes (Signed)
52 y.o.Married Caucasian female [phone] with a 8 day(s) history of the following:discharge described as malodorous, green and milky following sexual activity.   Last sexual activity:2 days ago. Patient and spouse use sexual devices also. Patient wonders if this could be the cause. Treated for BV here 03/08/17 and symptoms resolved. Pt also reports the following associated symptoms: joint pain, but recent shoulder surgery. Patient has tried over the counter treatment with minimal relief for vaginal itching.. Patient does not have concerns about STD's but would like vaginal STD screening only. No other health issues today.     Exam:  AVW:[id number], Bartholin's, Urethra, Skene's normal, no lesions or redness  Inguinal lymph nodes non tender, no enlargement                Vag:no lesions, discharge: white, yellow, mucoid and odorless, no atrophy noted                Cx:  normal appearance and no CMT or lesions noted                Uterus:normal size, non-tender, normal shape and consistency                Adnexa: normal adnexa and no mass, fullness, tenderness    A:Normal pelvic exam R/O vaginal infection/STD infection Suspect vaginal dryness  P: discussed finding of normal pelvic exam. Will treat if indicated with labs Affirm, Gc/Chlamydia Discussed making sure devices are clean and check cleaner they are using this could be irritating her. Discussed Aveeno sitz bath for comfort and discussed coconut oil or OTC moisturizer trial to see if this helps. Questions addressed.  Rv prn

## 2017-04-29 ENCOUNTER — Other Ambulatory Visit: Payer: Self-pay

## 2017-04-29 LAB — GC/CHLAMYDIA PROBE AMP
CHLAMYDIA, DNA PROBE: NEGATIVE
Neisseria gonorrhoeae by PCR: NEGATIVE

## 2017-04-29 LAB — VAGINITIS/VAGINOSIS, DNA PROBE
Candida Species: NEGATIVE
GARDNERELLA VAGINALIS: POSITIVE — AB
TRICHOMONAS VAG: NEGATIVE

## 2017-04-29 MED ORDER — METRONIDAZOLE 0.75 % VA GEL: 1.0000 | Freq: Two times a day (BID) | VAGINAL | 0 refills | Status: DC

## 2017-06-28 ENCOUNTER — Ambulatory Visit: Payer: BLUE CROSS/BLUE SHIELD | Admitting: Nurse Practitioner

## 2017-07-06 ENCOUNTER — Ambulatory Visit: Payer: BLUE CROSS/BLUE SHIELD | Admitting: Certified Nurse Midwife

## 2017-07-29 ENCOUNTER — Other Ambulatory Visit: Payer: Self-pay

## 2017-07-29 ENCOUNTER — Encounter: Payer: Self-pay | Admitting: Certified Nurse Midwife

## 2017-07-29 ENCOUNTER — Ambulatory Visit: Payer: BLUE CROSS/BLUE SHIELD | Admitting: Certified Nurse Midwife

## 2017-07-29 ENCOUNTER — Other Ambulatory Visit (HOSPITAL_COMMUNITY)
Admission: RE | Admit: 2017-07-29 | Discharge: 2017-07-29 | Disposition: A | Discharge status: Home / Self Care | Payer: BLUE CROSS/BLUE SHIELD | Source: Ambulatory Visit | Attending: Certified Nurse Midwife | Admitting: Certified Nurse Midwife

## 2017-07-29 VITALS — BP 110/74 | HR 68 | Resp 16 | Ht 63.75 in | Wt 163.0 lb

## 2017-07-29 DIAGNOSIS — F418 Other specified anxiety disorders: Secondary | ICD-10-CM

## 2017-07-29 DIAGNOSIS — Z79890 Hormone replacement therapy (postmenopausal): Secondary | ICD-10-CM

## 2017-07-29 DIAGNOSIS — Z01419 Encounter for gynecological examination (general) (routine) without abnormal findings: Secondary | ICD-10-CM

## 2017-07-29 DIAGNOSIS — Z124 Encounter for screening for malignant neoplasm of cervix: Secondary | ICD-10-CM

## 2017-07-29 DIAGNOSIS — F419 Anxiety disorder, unspecified: Secondary | ICD-10-CM | POA: Diagnosis not present

## 2017-07-29 DIAGNOSIS — B009 Herpesviral infection, unspecified: Secondary | ICD-10-CM

## 2017-07-29 DIAGNOSIS — N951 Menopausal and female climacteric states: Secondary | ICD-10-CM

## 2017-07-29 DIAGNOSIS — F32A Depression, unspecified: Secondary | ICD-10-CM

## 2017-07-29 DIAGNOSIS — F329 Major depressive disorder, single episode, unspecified: Secondary | ICD-10-CM

## 2017-07-29 MED ORDER — VALACYCLOVIR HCL 500 MG PO TABS: ORAL_TABLET | ORAL | 8 refills | Status: DC

## 2017-07-29 MED ORDER — CONJ ESTROG-MEDROXYPROGEST ACE 0.3-1.5 MG PO TABS: 1.0000 | ORAL_TABLET | Freq: Every day | ORAL | 12 refills | Status: DC

## 2017-07-29 MED ORDER — ESCITALOPRAM OXALATE 10 MG PO TABS: 10.0000 mg | ORAL_TABLET | Freq: Every day | ORAL | 3 refills | Status: DC

## 2017-07-29 NOTE — Patient Instructions (Signed)

## 2017-07-29 NOTE — Progress Notes (Signed)
52 y.o. [id number] Married  Caucasian Fe here for annual exam. Menopausal on HRT, tried to stop use and use OTC herbs and felt this was not a good choice. HRT working well now, since she restarted. . Still taking Lexapro with some changes in emotional health. Feel she is in a dark place at times, No thoughts of self harm. Has decreased social time and this has not helped. Denies vaginal bleeding or vaginal dryness problems. Sees [facility] PCP for allergy and management. Continue to have occasional HSV outbreak, needs up date on Rx. No other health issues today.  Patient's last menstrual period was 12/05/2013 (approximate).          Sexually active: Yes.    The current method of family planning is vasectomy.    Exercising: Yes.    some Smoker:  no  Health Maintenance: Pap:  06-17-15 neg HPV HR neg, 06-22-16 neg HPV HR neg History of Abnormal Pap: yes MMG:  11-18-16 neg Self Breast exams: yes Colonoscopy:  2016 possible serous adenoma polyp. F/u 10yrs BMD:   none TDaP:  2012 Shingles: no Pneumonia: no Hep C and HIV: both neg 2017 Labs: with pcp   reports that she has never smoked. She has never used smokeless tobacco. She reports that she drinks about 2.0 oz of alcohol per week. She reports that she does not use drugs.  Past Medical History:  Diagnosis Date  . Abnormal pap 09/01   CIN1, II HR HPV  . Depression   . Dyspareunia 03/04   PUS (neg)  . Echocardiogram abnormal about 1998   physiological mitral & tricuspid regurge, but no MVP, No SBE prophylaxis  . GERD (gastroesophageal reflux disease) 10/04  . H/O tension headache 09/01   family stressors  . HSV (herpes simplex virus) anogenital infection hx  . IBS (irritable bowel syndrome)   . IC (interstitial cystitis) 11/2002   Dr. Amalia Hailey  . Rotator cuff injury    partial tear left arm  . STD (sexually transmitted disease) 11/30/99, 12/06   HSV, 12/06  . TMJ (temporomandibular joint syndrome) 2004    Past Surgical History:   Procedure Laterality Date  . AUGMENTATION MAMMAPLASTY  01/08   reconstruction  . COLPOSCOPY  10/01   no lesion  . FOOT SURGERY  2012   right   . KNEE ARTHROPLASTY  1998   left knee reconstruction  . ROTATOR CUFF REPAIR Left 2019  . VAGINA SURGERY  1982   rectal/vaginal repair post partum    Current Outpatient Medications  Medication Sig Dispense Refill  . calcium carbonate (OS-CAL) 600 MG TABS Take 600 mg by mouth 2 (two) times daily with a meal.    . escitalopram (LEXAPRO) 10 MG tablet Take 1 tablet (10 mg total) by mouth daily. 30 tablet 3  . estrogen, conjugated,-medroxyprogesterone (PREMPRO) 0.3-1.5 MG tablet Take 1 tablet by mouth daily. 30 tablet 4  . EVENING PRIMROSE OIL PO Take by mouth. Take 1300mg  once daily    . fluticasone (FLONASE) 50 MCG/ACT nasal spray SPRAY 2 SPRAYS INTO EACH NOSTRIL EVERY DAY  2  . Hyoscyamine Sulfate (LEVBID PO) Take 0.375 mg by mouth daily. Takes 1 tablet    . latanoprost (XALATAN) 0.005 % ophthalmic solution Place 1 drop into both eyes at bedtime.     Marland Kitchen loratadine (CLARITIN) 10 MG tablet Take 10 mg by mouth daily.    . Multiple Vitamins-Minerals (MULTIVITAMIN PO) Take by mouth daily.    . Omega-3 Fatty Acids (FISH OIL)  1000 MG CAPS Take by mouth daily.    Marland Kitchen omeprazole (PRILOSEC) 20 MG capsule Take 40 mg by mouth daily.     . S-Adenosylmethionine (SAM-E PO) Take 200 mg by mouth daily.     . valACYclovir (VALTREX) 500 MG tablet TAKE 1 TABLET BY MOUTH TWICE A DAY 60 tablet 8   No current facility-administered medications for this visit.     Family History  Problem Relation Age of Onset  . Cervical cancer Mother   . Scoliosis Mother   . Diabetes Mother 59  . Cervical cancer Maternal Grandmother   . Lung cancer Maternal Grandfather     ROS:  Pertinent items are noted in HPI.  Otherwise, a comprehensive ROS was negative.  Exam:   BP 110/74   Pulse 68   Resp 16   Ht 5' 3.75" (1.619 m)   Wt 163 lb (73.9 kg)   LMP 12/05/2013  (Approximate)   BMI 28.20 kg/m  Height: 5' 3.75" (161.9 cm) Ht Readings from Last 3 Encounters:  07/29/17 5' 3.75" (1.619 m)  04/28/17 5\' 4"  (1.626 m)  03/08/17 5\' 4"  (1.626 m)    General appearance: alert, cooperative and appears stated age Head: Normocephalic, without obvious abnormality, atraumatic Neck: no adenopathy, supple, symmetrical, trachea midline and thyroid normal to inspection and palpation Lungs: clear to auscultation bilaterally Breasts: normal appearance, no masses or tenderness, No nipple retraction or dimpling, No nipple discharge or bleeding, No axillary or supraclavicular adenopathy Heart: regular rate and rhythm Abdomen: soft, non-tender; no masses,  no organomegaly Extremities: extremities normal, atraumatic, no cyanosis or edema Skin: Skin color, texture, turgor normal. No rashes or lesions Lymph nodes: Cervical, supraclavicular, and axillary nodes normal. No abnormal inguinal nodes palpated Neurologic: Grossly normal   Pelvic: External genitalia:  no lesions              Urethra:  normal appearing urethra with no masses, tenderness or lesions              Bartholin's and Skene's: normal                 Vagina: normal appearing vagina with normal color and discharge, no lesions              Cervix: no cervical motion tenderness, no lesions and normal appearance              Pap taken: Yes.   Bimanual Exam:  Uterus:  normal size, contour, position, consistency, mobility, non-tender and anteverted              Adnexa: normal adnexa and no mass, fullness, tenderness               Rectovaginal: Confirms               Anus:  normal sphincter tone, no lesions  Chaperone present: yes  A:  Well Woman with normal exam  Menopausal on HRT desires continuance  Anxiety/Depression on Lexapro desires continuance  History of HSV needs Rx refill  P:   Reviewed health and wellness pertinent to exam  Aware if vaginal bleeding to advise  Risks/benefits/warning signs  reviewed, desires continuance   Rx Prempro see order with instructions  Rx Lexapro see order with instructions, can increase by 1/4 if needed, and advise if she is doing this.  Rx Valtrex see order with instructions  Pap smear: yes   counseled on breast self exam, mammography screening, feminine hygiene, use and side effects of  HRT, adequate intake of calcium and vitamin D, diet and exercise  return annually or prn  An After Visit Summary was printed and given to the patient.

## 2017-08-02 LAB — CYTOLOGY - PAP
Adequacy: ABSENT
DIAGNOSIS: NEGATIVE
HPV: NOT DETECTED

## 2017-08-10 ENCOUNTER — Encounter: Payer: Self-pay | Admitting: Certified Nurse Midwife

## 2017-10-10 ENCOUNTER — Ambulatory Visit: Payer: BLUE CROSS/BLUE SHIELD | Admitting: Obstetrics and Gynecology

## 2017-10-10 ENCOUNTER — Encounter: Payer: Self-pay | Admitting: Obstetrics and Gynecology

## 2017-10-10 VITALS — BP 148/90 | HR 63 | Ht 63.75 in | Wt 168.0 lb

## 2017-10-10 DIAGNOSIS — N898 Other specified noninflammatory disorders of vagina: Secondary | ICD-10-CM

## 2017-10-10 NOTE — Addendum Note (Signed)
Addended by: Dorothy Spark on: 10/10/2017 01:02 PM   Modules accepted: Orders

## 2017-10-10 NOTE — Progress Notes (Signed)
GYNECOLOGY  VISIT   HPI: 52 y.o.   Married  Caucasian  female   [phone] with Patient's last menstrual period was 12/05/2013 (approximate).   here for abnormal vaginal discharge.  Went to PCP and was treated orally and gel.  Patient has also tried the OTC, didn't help.  Patient discribes discharge as cottage cheese and odor.   She was treated for BV in 1/19, 3/19 and 8/19. After her last dx of BV the oral treatment wasn't working, then got the gel, seemed to help. A day or so after stopping the gel she started having a thick, clumpy d/c. She self treated with OTC medication about a week ago.  Current symptoms are a thick, clumpy vaginal d/c with a slight vaginal odor. She noted a small white bump on her vulva and popped it, that is tender, but no other vulvar itching, burning or irritation.  She has noticed a green yellow color to her tongue.  She is sexually active about 1 x a month. She has a h/o IBS  GYNECOLOGIC HISTORY: Patient's last menstrual period was 12/05/2013 (approximate). Contraception:post menopausal Menopausal hormone therapy: prempro, evening primrose oil.        OB History    Gravida  3   Para  1   Term  1   Preterm  0   AB  2   Living  1     SAB  0   TAB  0   Ectopic  0   Multiple  0   Live Births  1              There are no active problems to display for this patient.   Past Medical History:  Diagnosis Date  . Abnormal pap 09/01   CIN1, II HR HPV  . Depression   . Dyspareunia 03/04   PUS (neg)  . Echocardiogram abnormal about 1998   physiological mitral & tricuspid regurge, but no MVP, No SBE prophylaxis  . GERD (gastroesophageal reflux disease) 10/04  . H/O tension headache 09/01   family stressors  . HSV (herpes simplex virus) anogenital infection hx  . IBS (irritable bowel syndrome)   . IC (interstitial cystitis) 11/2002   Dr. Amalia Hailey  . Rotator cuff injury    partial tear left arm  . STD (sexually transmitted disease) 11/30/99,  12/06   HSV, 12/06  . TMJ (temporomandibular joint syndrome) 2004    Past Surgical History:  Procedure Laterality Date  . AUGMENTATION MAMMAPLASTY  01/08   reconstruction  . COLPOSCOPY  10/01   no lesion  . FOOT SURGERY  2012   right   . KNEE ARTHROPLASTY  1998   left knee reconstruction  . ROTATOR CUFF REPAIR Left 2019  . VAGINA SURGERY  1982   rectal/vaginal repair post partum    Current Outpatient Medications  Medication Sig Dispense Refill  . calcium carbonate (OS-CAL) 600 MG TABS Take 600 mg by mouth 2 (two) times daily with a meal.    . clonazePAM (KLONOPIN) 0.5 MG tablet Take 0.5 mg by mouth at bedtime.  0  . escitalopram (LEXAPRO) 10 MG tablet Take 1 tablet (10 mg total) by mouth daily. 30 tablet 3  . estrogen, conjugated,-medroxyprogesterone (PREMPRO) 0.3-1.5 MG tablet Take 1 tablet by mouth daily. 30 tablet 12  . EVENING PRIMROSE OIL PO Take by mouth. Take 1300mg  once daily    . fluticasone (FLONASE) 50 MCG/ACT nasal spray SPRAY 2 SPRAYS INTO EACH NOSTRIL EVERY DAY  2  .  Hyoscyamine Sulfate (LEVBID PO) Take 0.375 mg by mouth daily. Takes 1 tablet    . latanoprost (XALATAN) 0.005 % ophthalmic solution Place 1 drop into both eyes at bedtime.     Marland Kitchen loratadine (CLARITIN) 10 MG tablet Take 10 mg by mouth daily.    . Multiple Vitamins-Minerals (MULTIVITAMIN PO) Take by mouth daily.    . Omega-3 Fatty Acids (FISH OIL) 1000 MG CAPS Take by mouth daily.    Marland Kitchen omeprazole (PRILOSEC) 20 MG capsule Take 40 mg by mouth daily.     . S-Adenosylmethionine (SAM-E PO) Take 200 mg by mouth daily.     . valACYclovir (VALTREX) 500 MG tablet TAKE 1 TABLET BY MOUTH TWICE A DAY 60 tablet 8   No current facility-administered medications for this visit.      ALLERGIES: Patient has no known allergies.  Family History  Problem Relation Age of Onset  . Cervical cancer Mother   . Scoliosis Mother   . Diabetes Mother 56  . Cervical cancer Maternal Grandmother   . Lung cancer Maternal  Grandfather     Social History   Socioeconomic History  . Marital status: Married    Spouse name: Not on file  . Number of children: Not on file  . Years of education: Not on file  . Highest education level: Not on file  Occupational History  . Not on file  Social Needs  . Financial resource strain: Not on file  . Food insecurity:    Worry: Not on file    Inability: Not on file  . Transportation needs:    Medical: Not on file    Non-medical: Not on file  Tobacco Use  . Smoking status: Never Smoker  . Smokeless tobacco: Never Used  Substance and Sexual Activity  . Alcohol use: Yes    Alcohol/week: 4.0 standard drinks    Types: 4 Standard drinks or equivalent per week  . Drug use: No  . Sexual activity: Yes    Partners: Male    Comment: husband vasectomy  Lifestyle  . Physical activity:    Days per week: Not on file    Minutes per session: Not on file  . Stress: Not on file  Relationships  . Social connections:    Talks on phone: Not on file    Gets together: Not on file    Attends religious service: Not on file    Active member of club or organization: Not on file    Attends meetings of clubs or organizations: Not on file    Relationship status: Not on file  . Intimate partner violence:    Fear of current or ex partner: Not on file    Emotionally abused: Not on file    Physically abused: Not on file    Forced sexual activity: Not on file  Other Topics Concern  . Not on file  Social History Narrative  . Not on file    Review of Systems  Constitutional: Negative.   HENT: Negative.   Eyes: Negative.   Respiratory: Negative.   Cardiovascular: Negative.   Gastrointestinal: Negative.   Genitourinary: Negative.        Abnormal vaginal discharge  Musculoskeletal: Negative.   Skin: Negative.   Neurological: Negative.   Endo/Heme/Allergies: Negative.   Psychiatric/Behavioral: Negative.   All other systems reviewed and are negative.   PHYSICAL EXAMINATION:     BP (!) 148/90   Pulse 63   Ht 5' 3.75" (1.619 m)   Wt  168 lb (76.2 kg)   LMP 12/05/2013 (Approximate)   BMI 29.06 kg/m     General appearance: alert, cooperative and appears stated age  Pelvic: External genitalia:  no lesions, focal area of irritation on the right inner labia minora. No other erythema.               Urethra:  normal appearing urethra with no masses, tenderness or lesions              Bartholins and Skenes: normal                 Vagina: normal appearing vagina with normal color slight amount of thick, white vaginal d/c              Cervix: no lesions               Chaperone was present for exam.  Wet prep: ? Clue vs artifact, no trich, few wbc KOH: no yeast PH: 4-4.5   ASSESSMENT Vaginal discharge H/O BV x 3 this year Currently with slight amount of thick, clumpy white vaginal d/c, no erythema or other signs/symptoms of yeast. Could be left over from her vaginal treatment last week    PLAN Affirm sent Further treatment depending on results If she does have recurrent BV again this year, I would recommend treatment with suppression.    An After Visit Summary was printed and given to the patient.  ~15 minutes face to face time of which over 50% was spent in counseling.   CC: Evalee Mutton, CNM

## 2017-10-11 ENCOUNTER — Telehealth: Payer: Self-pay | Admitting: *Deleted

## 2017-10-11 LAB — VAGINITIS/VAGINOSIS, DNA PROBE
Candida Species: NEGATIVE
GARDNERELLA VAGINALIS: POSITIVE — AB
Trichomonas vaginosis: NEGATIVE

## 2017-10-11 MED ORDER — METRONIDAZOLE 0.75 % VA GEL: VAGINAL | 1 refills | Status: DC

## 2017-10-11 NOTE — Telephone Encounter (Signed)
Notes recorded by Burnice Logan, RN on 10/11/2017 at 10:11 AM EDT Spoke with patient, advised as seen below per Dr. Talbert Nan. Patient states she has not had good results with Flagyl PO in the past, asking if metrogel appropriate to start? Advised will review with Dr. Talbert Nan and return call with recommendations. No Rx sent at this time, see telephone encounter dated 8/27 to review with provider.    Dr. Talbert Nan  -please review and advise.

## 2017-10-11 NOTE — Telephone Encounter (Signed)
Spoke with patient, advised as seen below per Dr. Talbert Nan. Patient request 90 days supply. Rx to verified pharmacy. Patient verbalizes understanding and is agreeable.   Encounter closed.

## 2017-10-11 NOTE — Telephone Encounter (Signed)
Yes, she can use the metrogel, 1 applicator vaginally qhs x 10 days, then 2 x a week for 6 months.

## 2017-10-11 NOTE — Telephone Encounter (Signed)
-----   Message from Salvadore Dom, MD sent at 10/11/2017  9:01 AM EDT ----- Please let the patient know that she has recurrent BV. She should go on suppression, we discussed this briefly yesterday. Please call in flagyl 500 mg BID x 10 days, then metrogel, 1 applicator vaginally 2 x a week x 6 months.

## 2017-12-28 ENCOUNTER — Ambulatory Visit: Payer: BLUE CROSS/BLUE SHIELD | Admitting: Certified Nurse Midwife

## 2017-12-28 ENCOUNTER — Other Ambulatory Visit: Payer: Self-pay

## 2017-12-28 ENCOUNTER — Encounter: Payer: Self-pay | Admitting: Certified Nurse Midwife

## 2017-12-28 VITALS — BP 110/68 | HR 68 | Resp 16 | Wt 167.0 lb

## 2017-12-28 DIAGNOSIS — N761 Subacute and chronic vaginitis: Secondary | ICD-10-CM | POA: Diagnosis not present

## 2017-12-28 DIAGNOSIS — K5900 Constipation, unspecified: Secondary | ICD-10-CM

## 2017-12-28 DIAGNOSIS — N898 Other specified noninflammatory disorders of vagina: Secondary | ICD-10-CM | POA: Diagnosis not present

## 2017-12-28 NOTE — Progress Notes (Signed)
52 y.o. Married Caucasian female [phone] here with complaint of vaginal symptoms of increase discharge only. Denies odor, itching or burning. Has been treated with Diflucan and Tindamax for chronic BV and suppressive therapy with Metrnidazole also use over the past 5 months.. Describes discharge as white thick discharge, at times and then none. Onset of this occurrence and  symptoms 3 days ago. Denies new personal products or vaginal dryness. Patient coincidentally has also had constipation/diarrhea alternating during the course of BV and yeast. Seeing GI for management now of constipation and using stool softener. She also was on antibiotics after recent rotator cuff repair and was treated with Diflucan. Frustrated, so has started on herbal supplement and probiotics. Not sexually active at present. No recent HSV outbreaks. No STD concerns. Urinary symptoms none that she is aware of. No other health issues.   Review of Systems  Constitutional: Negative.   HENT: Negative.   Eyes: Negative.   Respiratory: Negative.   Cardiovascular: Negative.   Gastrointestinal: Negative.   Genitourinary:       Vaginal itching and thick white non odorous discharge  Musculoskeletal:       With right shoulder pain with recent rotator cuff repair  Skin: Negative.   Neurological: Negative.   Endo/Heme/Allergies: Negative.   Psychiatric/Behavioral: Negative.     O:Healthy female WDWN Affect: normal, orientation x 3  Exam: Skin: warm and dry Abdomen: soft, non tender Inguinal Lymph nodes: no enlargement or tenderness Pelvic exam: External genital: normal female, no scaling, redness or exudate BUS: negative Vagina: very small amount of thin white non odorous discharge noted.  Affirm taken Cervix: normal, non tender, no CMT Uterus: normal, non tender Adnexa:normal, non tender, no masses or fullness noted Rectum: small amount stool at anal opening removed with tissue after exam   A:Normal pelvic  exam History of chronic BV and yeast with suppression treatment with no success Concurrent bowel issues with constipation and diarrhea, being treated now R/O vaginal infection    P:Discussed findings of normal pelvic exam. Discussed stool contamination around vagina which could be a cause of the frequent vaginitis occurrence. Discussed importance of cleaning appropriately and not back to front to avoid this. Discussed discharge finding and no treatment unless indicated. Work on bowel health with MD. Discussed starting on oral refrigerated probiotics, she has just purchased some and take daily.. Discussed Aveeno or baking soda sitz bath for comfort and to treat symptoms now.. Avoid moist clothes or pads for extended period of time. Coconut Oil use for skin protection prior to activity can be used to external skin for protection or dryness. Questions addressed at length. Patient agreeable to plan and will advise if no change. Continue follow up with GI. Lab: Affirm  Rv prn  15 minutes in face to face consult in addition to exam regarding vaginal issues.

## 2017-12-29 LAB — VAGINITIS/VAGINOSIS, DNA PROBE
CANDIDA SPECIES: NEGATIVE
Gardnerella vaginalis: POSITIVE — AB
Trichomonas vaginosis: NEGATIVE

## 2018-01-03 ENCOUNTER — Other Ambulatory Visit: Payer: Self-pay | Admitting: *Deleted

## 2018-01-03 MED ORDER — NONFORMULARY OR COMPOUNDED ITEM: 0 refills | Status: DC

## 2018-01-03 MED ORDER — TINIDAZOLE 500 MG PO TABS: 500.0000 mg | ORAL_TABLET | Freq: Two times a day (BID) | ORAL | 0 refills | Status: AC

## 2018-01-26 ENCOUNTER — Other Ambulatory Visit: Payer: Self-pay

## 2018-01-26 ENCOUNTER — Ambulatory Visit (INDEPENDENT_AMBULATORY_CARE_PROVIDER_SITE_OTHER): Payer: BLUE CROSS/BLUE SHIELD | Admitting: Certified Nurse Midwife

## 2018-01-26 ENCOUNTER — Other Ambulatory Visit: Payer: Self-pay | Admitting: Certified Nurse Midwife

## 2018-01-26 VITALS — BP 100/62 | HR 68 | Resp 16 | Wt 167.0 lb

## 2018-01-26 DIAGNOSIS — F329 Major depressive disorder, single episode, unspecified: Secondary | ICD-10-CM

## 2018-01-26 DIAGNOSIS — N760 Acute vaginitis: Secondary | ICD-10-CM | POA: Diagnosis not present

## 2018-01-26 DIAGNOSIS — F32A Depression, unspecified: Secondary | ICD-10-CM

## 2018-01-26 DIAGNOSIS — F419 Anxiety disorder, unspecified: Secondary | ICD-10-CM | POA: Diagnosis not present

## 2018-01-26 DIAGNOSIS — B9689 Other specified bacterial agents as the cause of diseases classified elsewhere: Secondary | ICD-10-CM

## 2018-01-26 DIAGNOSIS — N951 Menopausal and female climacteric states: Secondary | ICD-10-CM | POA: Diagnosis not present

## 2018-01-26 MED ORDER — ESCITALOPRAM OXALATE 10 MG PO TABS: 10.0000 mg | ORAL_TABLET | Freq: Every day | ORAL | 6 refills | Status: DC

## 2018-01-26 NOTE — Progress Notes (Signed)
  52 y.o. Married Caucasian menopausal female [phone] here for follow up of Vaginitis treated with Tindazole and Boric Acid per protocol for chronic occurence initiated on 12/30/17. Had no problems with use of Boric Acid and hopeful this resolves the issue. Denies any vaginal discharge or odor, itching or burning at this time. No other health problems today. Not sexually active during treatment or after completion. Completed all medication as directed. Hopeful she will have no other occurrence.  Review of Systems  Constitutional: Negative.   HENT: Negative.   Eyes: Negative.   Respiratory: Negative.   Cardiovascular: Negative.   Gastrointestinal: Negative.   Genitourinary: Negative for dysuria and frequency.       No vaginal odor, itching or odor  Musculoskeletal: Negative.   Skin: Negative.   Neurological: Negative.   Psychiatric/Behavioral: Negative.        O: Healthy WD,WN female Affect: Normal orientation x 3 Skin:Warm and dty Abdomen:soft, non tender Pelvic exam:EXTERNAL GENITALIA: normal appearing vulva with no masses, tenderness or lesions VAGINA: no abnormal discharge or lesions CERVIX: no lesions or cervical motion tenderness UTERUS: anteverted and normal, no masses or tenderness ADNEXA: no masses palpable and nontender RECTUM: no irritation in rectal area  A:History of Chronic BV, treated with Tindazole and Boric Acid Considering suppression regimen for 6 months, if all clear at this time Normal pelvic exam Menopausal symptoms with Anxiety    P: Discussed findings of normal exam and no evidence of vaginal infection, but will wait for lab results. Discussed if normal can start Metrogel twice weekly for suppression if she desires. Patient will consider, may want to maintain with Boric Acid if possible. Lexapro working well for menopausal anxiety related symptoms and her HRT. Needs update on Lexapro. Questions addressed. Rx Lexapro see order with instructions Labs  Affirm   RV prn

## 2018-01-27 ENCOUNTER — Encounter: Payer: Self-pay | Admitting: Certified Nurse Midwife

## 2018-01-27 LAB — VAGINITIS/VAGINOSIS, DNA PROBE
Candida Species: NEGATIVE
GARDNERELLA VAGINALIS: POSITIVE — AB
Trichomonas vaginosis: NEGATIVE

## 2018-02-01 ENCOUNTER — Telehealth: Payer: Self-pay | Admitting: *Deleted

## 2018-02-01 MED ORDER — METRONIDAZOLE 0.75 % VA GEL: 1.0000 | VAGINAL | 5 refills | Status: DC

## 2018-02-01 NOTE — Telephone Encounter (Signed)
Suppression treatment is one applicator of Metrogel twice weekly. Can not use Flagyl for suppression. Please advise patient  And will need Rx Metrogel

## 2018-02-01 NOTE — Telephone Encounter (Signed)
Call to patient. Advised patient of message from Debbi as seen below and patient verbalized understanding. Reviewed with Debbi and patient to do suppression treatment for 4-6 months and return call in about a month to give an update after starting treament. Patient agreeable. Instructions on use of metrogel reviewed with patient and she verbalized understanding. Rx for metrogel, #70g, 5RF sent to confirmed pharmacy on file.   Routing to provider and will close encounter.

## 2018-02-01 NOTE — Telephone Encounter (Signed)
Call to patient. Results reviewed with patient and she verbalized understanding. Patient states she would like to begin treatment at this point. Patient states she thought she had discussed an oral antibiotic with Debbi for treatment. RN advised would review with Melvia Heaps, CNM and return call. Patient agreeable.   Routing to provider for review.

## 2018-02-01 NOTE — Telephone Encounter (Signed)
-----   Message from Regina Eck, CNM sent at 01/31/2018  8:11 AM EST ----- Notify patient her vaginal screening was negative for yeast and trichomonas and positive for BV again after the treatment regimen. She was asymptomatic and I am inclined to hold treatment at this point or start the Metrogel treatment twice weekly for suppression. She indicated she was not sure she what she wanted to do. Please advise.

## 2018-05-11 ENCOUNTER — Telehealth: Payer: Self-pay | Admitting: Certified Nurse Midwife

## 2018-05-11 MED ORDER — FLUCONAZOLE 150 MG PO TABS: 150.0000 mg | ORAL_TABLET | Freq: Once | ORAL | 0 refills | Status: AC

## 2018-05-11 NOTE — Telephone Encounter (Signed)
Spoke with patient. Rx for diflucan 150mg  po x 1, repeat 72 hours.  #2/0RF sent to pharmacy on file.  If it doesn't resolve, will need to come into office.  Patient verbalizes understanding. Encounter closed.

## 2018-05-11 NOTE — Telephone Encounter (Signed)
Patient is calling regarding possible yeast infection. Patient stated that she is on "long term treatment for BV and is experiencing redness, itching, and a little bit of swelling." Patient stated that this has been going on for 3 days and continues to have discharge.

## 2018-05-11 NOTE — Telephone Encounter (Signed)
Ok to send in rx for diflucan 150mg  po x 1, repeat 72 hours.  #2/0RF.  If it doesn't resolve, will need to come into office.

## 2018-05-11 NOTE — Telephone Encounter (Signed)
Spoke with patient. Patient states that she is receiving treatment for recurring BV with Metrogel place 1 applicator vaginally twice weekly. 3 days ago she developed vaginal redness, itching, swelling, and thick white discharge. Patient does not want to come to the office during Covid 19 state of emergency. Asking if [facility] [place] [facility]. Advised will review with covering MD and return call.

## 2018-05-25 ENCOUNTER — Other Ambulatory Visit: Payer: Self-pay | Admitting: Certified Nurse Midwife

## 2018-05-25 DIAGNOSIS — F419 Anxiety disorder, unspecified: Principal | ICD-10-CM

## 2018-05-25 DIAGNOSIS — F329 Major depressive disorder, single episode, unspecified: Secondary | ICD-10-CM

## 2018-05-25 DIAGNOSIS — F32A Depression, unspecified: Secondary | ICD-10-CM

## 2018-05-25 NOTE — Telephone Encounter (Signed)
Medication refill request: lexapro 10mg  Last AEX:  07/29/17 DL Next AEX: 08/03/18 DL Last MMG (if hormonal medication request): 2018 Refill authorized: 01/26/18 #30tabs/6R.   Pharmacy requesting 90 days supply.  Please advise.

## 2018-05-29 ENCOUNTER — Other Ambulatory Visit: Payer: Self-pay | Admitting: Certified Nurse Midwife

## 2018-05-29 DIAGNOSIS — B009 Herpesviral infection, unspecified: Secondary | ICD-10-CM

## 2018-05-29 NOTE — Telephone Encounter (Signed)
Medication refill request: Valtrex Last AEX:  07/29/17 DL Next AEX: 08/03/18 Last MMG (if hormonal medication request): 11-18-16 neg Refill authorized: Order pended #180 w/0 refills if authorized

## 2018-07-05 ENCOUNTER — Other Ambulatory Visit: Payer: Self-pay | Admitting: Certified Nurse Midwife

## 2018-07-05 NOTE — Telephone Encounter (Signed)
Medication refill request: Metrogel Last AEX:  07/29/17 DL Next AEX: 08/03/18  Last MMG (if hormonal medication request): 11-18-16 neg Refill authorized: Order pended #70g w/ 0 refills if authorized.

## 2018-07-11 ENCOUNTER — Telehealth: Payer: Self-pay | Admitting: Certified Nurse Midwife

## 2018-07-11 NOTE — Telephone Encounter (Signed)
Spoke with patient and notified of Dr.Jertson's recommendations. Patient states she will wait and see Debbie at [facility]. She will call back if symptoms worsen and see another provider. She thanked me for my time.

## 2018-07-11 NOTE — Telephone Encounter (Signed)
Called patient and left message for her to return my call. 

## 2018-07-11 NOTE — Telephone Encounter (Signed)
Patient has a question for nurse before requesting refill of metronidazole.

## 2018-07-11 NOTE — Telephone Encounter (Signed)
Spoke with patient regarding refill on Metrogel. She has been using Metrogel 2x/week for suprression therapy since 02-01-18 and has no refill left. Symptoms really have not changed with treatment--still with chronic thick  vaginal discharge w/o odor. She has AEX with Melvia Heaps, CNM on 08-03-2018. Patient wants to know if she should continue therapy until AEX? If so, she needs refill. Routed to provider

## 2018-07-11 NOTE — Telephone Encounter (Signed)
She should be evaluated prior to refilling her script. She can do that this week, or weight for her annual.

## 2018-07-31 ENCOUNTER — Other Ambulatory Visit: Payer: Self-pay

## 2018-08-01 NOTE — Progress Notes (Signed)
53 y.o. G73P1021 Married  Caucasian Fe here for annual exam. Menopausal on HRT, working well. Denies any warning signs with use. Still having occasional hot flashes. Denies vaginal bleeding Continues follow up with PCP regarding hypertension no change in dosage. 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 Still healing after Mose procedure. Continues to follow up with Dermatology. Completed 6 months of Metrogel and has no issues today that she is aware of. Not sexually active at present. No other health issues today.  Patient's last menstrual period was 12/05/2013 (approximate).          Sexually active: No.  The current method of family planning is vasectomy.    Exercising: Yes.    walking Smoker:  no  Review of Systems  Constitutional: Negative.   HENT: Negative.   Eyes: Negative.   Respiratory: Negative.   Cardiovascular: Negative.   Gastrointestinal: Negative.   Genitourinary: Negative.   Musculoskeletal: Negative.   Skin:       Vaginal discharge  Neurological: Negative.   Endo/Heme/Allergies: Negative.   Psychiatric/Behavioral: Negative.     Health Maintenance: Pap:  07-29-17 neg HPV HR neg History of Abnormal Pap: yes MMG:  Per patient 2020 mammo was done Self Breast exams: yes Colonoscopy:  2016 possible serous adenoma polyp. F/u 34yrs BMD:   none TDaP:  2012 Shingles: not done Pneumonia: not done Hep C and HIV: both neg 2017 Labs: if needed   reports that she has never smoked. She has never used smokeless tobacco. She reports current alcohol use of about 4.0 standard drinks of alcohol per week. She reports that she does not use drugs.  Past Medical History:  Diagnosis Date  . Abnormal pap 09/01   CIN1, II HR HPV  . Depression   . Dyspareunia 03/04   PUS (neg)  . Echocardiogram abnormal about 1998   physiological mitral & tricuspid regurge, but no MVP, No SBE prophylaxis  . GERD (gastroesophageal reflux disease) 10/04  . H/O tension headache  09/01   family stressors  . HSV (herpes simplex virus) anogenital infection hx  . Hypertension   . IBS (irritable bowel syndrome)   . IC (interstitial cystitis) 11/2002   Dr. Amalia Hailey  . Rotator cuff injury    partial tear left arm  . STD (sexually transmitted disease) 11/30/99, 12/06   HSV, 12/06  . TMJ (temporomandibular joint syndrome) 2004    Past Surgical History:  Procedure Laterality Date  . AUGMENTATION MAMMAPLASTY  01/08   reconstruction  . COLPOSCOPY  10/01   no lesion  . FOOT SURGERY  2012   right   . KNEE ARTHROPLASTY  1998   left knee reconstruction  . ROTATOR CUFF REPAIR  2019   1/19 left arm, 11/19 right arm  . VAGINA SURGERY  1982   rectal/vaginal repair post partum    Current Outpatient Medications  Medication Sig Dispense Refill  . amLODipine (NORVASC) 2.5 MG tablet Take by mouth.    . escitalopram (LEXAPRO) 10 MG tablet TAKE 1 TABLET BY MOUTH EVERY DAY 90 tablet 0  . estrogen, conjugated,-medroxyprogesterone (PREMPRO) 0.3-1.5 MG tablet Take 1 tablet by mouth daily. 30 tablet 12  . EVENING PRIMROSE OIL PO Take by mouth. Take 1300mg  once daily    . fluticasone (FLONASE) 50 MCG/ACT nasal spray SPRAY 2 SPRAYS INTO EACH NOSTRIL EVERY DAY  2  . glycopyrrolate (ROBINUL) 2 MG tablet Take 2 mg by mouth 2 (two) times daily.    Marland Kitchen latanoprost (XALATAN)  0.005 % ophthalmic solution Place 1 drop into both eyes at bedtime.     Marland Kitchen loratadine (CLARITIN) 10 MG tablet Take 10 mg by mouth daily.    . metroNIDAZOLE (METROGEL) 0.75 % vaginal gel Place 1 Applicatorful vaginally 2 (two) times a week. 70 g 5  . Omega-3 Fatty Acids (FISH OIL) 1000 MG CAPS Take by mouth daily.    Marland Kitchen omeprazole (PRILOSEC) 20 MG capsule Take 40 mg by mouth daily.     . S-Adenosylmethionine (SAM-E PO) Take 200 mg by mouth daily.     . valACYclovir (VALTREX) 500 MG tablet TAKE 1 TABLET BY MOUTH TWICE A DAY.  TAKE FOR 3 DAYS AS NEEDED FOR AN OUTBREAK. 180 tablet 0   No current facility-administered  medications for this visit.     Family History  Problem Relation Age of Onset  . Cervical cancer Mother   . Scoliosis Mother   . Diabetes Mother 64  . Cervical cancer Maternal Grandmother   . Lung cancer Maternal Grandfather     ROS:  Pertinent items are noted in HPI.  Otherwise, a comprehensive ROS was negative.  Exam:   LMP 12/05/2013 (Approximate)    Ht Readings from Last 3 Encounters:  10/10/17 5' 3.75" (1.619 m)  07/29/17 5' 3.75" (1.619 m)  04/28/17 5\' 4"  (1.626 m)    General appearance: alert, cooperative and appears stated age Head: Normocephalic, without obvious abnormality, atraumatic Neck: no adenopathy, supple, symmetrical, trachea midline and thyroid normal to inspection and palpation Lungs: clear to auscultation bilaterally Breasts: normal appearance, no masses or tenderness, No nipple retraction or dimpling, No nipple discharge or bleeding, No axillary or supraclavicular adenopathy Heart: regular rate and rhythm Abdomen: soft, non-tender; no masses,  no organomegaly Extremities: extremities normal, atraumatic, no cyanosis or edema Skin: Skin color, texture, turgor normal. No rashes or lesions Lymph nodes: Cervical, supraclavicular, and axillary nodes normal. No abnormal inguinal nodes palpated Neurologic: Grossly normal   Pelvic: External genitalia:  no lesions              Urethra:  normal appearing urethra with no masses, tenderness or lesions              Bartholin's and Skene's: normal                 Vagina: normal appearing vagina with normal color and discharge, no lesions, no odor noted, affirm taken              Cervix: no cervical motion tenderness, no lesions and normal appearance              Pap taken: No. Bimanual Exam:  Uterus:  normal size, contour, position, consistency, mobility, non-tender and anteverted              Adnexa: normal adnexa and no mass, fullness, tenderness               Rectovaginal: Confirms               Anus:  normal  sphincter tone, no lesions  Chaperone present: yes  A:  Well Woman with normal exam  Menopausal on HRT desires continuance  Mammogram due per patient last one in 2019  Recent completion of chronic BV treatment, not symptomatic  History of HSV needs update Rx  Hypertension on stable medication per PCP  Recent Mose surgery for squamous cell carcinoma on left leg    P:   Reviewed health and wellness pertinent to exam  Aware of need to advise if vaginal bleeding. Discussed risks/benefits/warning signs with HRT use. Discussed will need current mammogram to continue. Patient to schedule. Has RX currently.  Lab: [facility]  Rx Valtrex see order with instructions  Continue follow up with MDs regarding other health issues.  Pap smear: no   counseled on breast self exam, mammography screening, STD prevention, HIV risk factors and prevention, menopause, adequate intake of calcium and vitamin D, diet and exercise, Kegel's exercises  return annually or prn  An After Visit Summary was printed and given to the patient.

## 2018-08-02 ENCOUNTER — Other Ambulatory Visit: Payer: Self-pay

## 2018-08-02 ENCOUNTER — Encounter: Payer: Self-pay | Admitting: Certified Nurse Midwife

## 2018-08-02 ENCOUNTER — Ambulatory Visit (INDEPENDENT_AMBULATORY_CARE_PROVIDER_SITE_OTHER): Payer: BLUE CROSS/BLUE SHIELD | Admitting: Certified Nurse Midwife

## 2018-08-02 VITALS — BP 114/64 | HR 68 | Temp 97.6°F | Resp 16 | Ht 64.0 in | Wt 169.0 lb

## 2018-08-02 DIAGNOSIS — N898 Other specified noninflammatory disorders of vagina: Secondary | ICD-10-CM | POA: Diagnosis not present

## 2018-08-02 DIAGNOSIS — N951 Menopausal and female climacteric states: Secondary | ICD-10-CM

## 2018-08-02 DIAGNOSIS — Z01419 Encounter for gynecological examination (general) (routine) without abnormal findings: Secondary | ICD-10-CM

## 2018-08-02 DIAGNOSIS — Z79890 Hormone replacement therapy (postmenopausal): Secondary | ICD-10-CM

## 2018-08-02 DIAGNOSIS — B009 Herpesviral infection, unspecified: Secondary | ICD-10-CM

## 2018-08-02 MED ORDER — VALACYCLOVIR HCL 500 MG PO TABS: ORAL_TABLET | ORAL | 1 refills | Status: AC

## 2018-08-03 ENCOUNTER — Ambulatory Visit: Payer: BLUE CROSS/BLUE SHIELD | Admitting: Certified Nurse Midwife

## 2018-08-03 LAB — VAGINITIS/VAGINOSIS, DNA PROBE
Candida Species: NEGATIVE
Gardnerella vaginalis: POSITIVE — AB
Trichomonas vaginosis: NEGATIVE

## 2018-08-04 ENCOUNTER — Telehealth: Payer: Self-pay | Admitting: Certified Nurse Midwife

## 2018-08-04 NOTE — Telephone Encounter (Signed)
Spoke with Melvia Heaps, CNM and then advised patient of Pos.BV. Advised Ms.Debbie wants to do some research to see if she can treat her with something other than Metrogel. She should have answer the first of the week. Patient thanked me for calling her.

## 2018-08-04 NOTE — Telephone Encounter (Signed)
    Patient calling for lab results 

## 2018-08-09 NOTE — Telephone Encounter (Signed)
Left message for call back.

## 2018-08-09 NOTE — Telephone Encounter (Signed)
-----   Message from Regina Eck, CNM sent at 08/09/2018  4:37 AM EDT ----- Notify patient have reviewed all updates regarding BV treatment after 6 month treatment and discussed with Dr. Quincy Simmonds. Recommendation is to not to treat unless symptomatic because BV is normal flora and symptoms occur only with overgrowth. Important to keep normal balance. I would recommend condom use  also, which is in latest information for prevention.

## 2018-08-18 ENCOUNTER — Other Ambulatory Visit: Payer: Self-pay | Admitting: Certified Nurse Midwife

## 2018-08-18 DIAGNOSIS — N951 Menopausal and female climacteric states: Secondary | ICD-10-CM

## 2018-08-18 DIAGNOSIS — Z79890 Hormone replacement therapy (postmenopausal): Secondary | ICD-10-CM

## 2018-08-21 NOTE — Telephone Encounter (Signed)
Patient sent the following correspondence through [facility]. Routing to triage to assist patient with request.  Dr. Hollice Espy,  During my last visit in late June, you advised that I needed to have a mammogram prior to the refill of my estrogen medication. I have an appointment with [facility] on 7/23 for the mammogram. I am [facility] that [facility] [place] [place]. I am one week from running out and I hate to have to have a lapse in use. You should be able to see the appointment if you look at [facility]'s appointment schedule.  Thanks,  Gabriela Figueroa

## 2018-08-21 NOTE — Telephone Encounter (Signed)
Medication refill request: Prempro Last AEX:  08/02/18 DL Next AEX: 08/07/19 Last MMG (if hormonal medication request): 11/22/16 BIRADS 1 negative -- per patient scheduled 09/07/18 with [facility] Refill authorized: Please advise

## 2018-08-21 NOTE — Telephone Encounter (Signed)
Will refill x 1 only until mammogram report in

## 2018-09-07 ENCOUNTER — Encounter: Payer: Self-pay | Admitting: Certified Nurse Midwife

## 2018-09-08 DIAGNOSIS — A6000 Herpesviral infection of urogenital system, unspecified: Secondary | ICD-10-CM | POA: Insufficient documentation

## 2018-09-12 ENCOUNTER — Other Ambulatory Visit: Payer: Self-pay

## 2018-09-12 ENCOUNTER — Other Ambulatory Visit: Payer: Self-pay | Admitting: Certified Nurse Midwife

## 2018-09-12 DIAGNOSIS — N951 Menopausal and female climacteric states: Secondary | ICD-10-CM

## 2018-09-12 DIAGNOSIS — Z79890 Hormone replacement therapy (postmenopausal): Secondary | ICD-10-CM

## 2018-09-12 NOTE — Telephone Encounter (Signed)
Need copy of mammogram to make sure current and normal  Last one was 2018

## 2018-09-12 NOTE — Telephone Encounter (Signed)
Medication refill request: Prempro  Last AEX:  08/02/2018 Next AEX: 08/06/19 Last MMG (if hormonal medication request): Done at [facility] 09/07/2018 Bi-rads 2 benign  Refill authorized: #[id number] with 3 rf

## 2018-09-12 NOTE — Telephone Encounter (Signed)
Mammogram report placed on your desk.

## 2019-05-07 ENCOUNTER — Encounter: Payer: Self-pay | Admitting: Certified Nurse Midwife

## 2019-08-07 ENCOUNTER — Ambulatory Visit: Payer: BLUE CROSS/BLUE SHIELD | Admitting: Certified Nurse Midwife

## 2019-08-08 ENCOUNTER — Other Ambulatory Visit: Payer: Self-pay

## 2019-08-08 NOTE — Progress Notes (Signed)
54 y.o. G30P1021 Married White or Caucasian Not Hispanic or Latino female here for annual exam. Patient would like to discuss pain on right side of her vagina during intercourse. This is an area that used to get tender prior to an HSV outbreak. Husband doesn't have HSV. She is on daily Valtrex, hasn't had an outbreak in 2 years. Some baseline vaginal dryness, lubricant helps slightly.  She is on prempro, not using vaginal estrogen. Not having vasomotor symptoms currently, doesn't want to go off the HRT.   Last August she fell and hurt her abdominal muscles. Got better and then starting bothering her again. She notices when she stretches or bends. Not tender to touch.    Patient's last menstrual period was 12/05/2013 (approximate).          Sexually active: Yes.    The current method of family planning is post menopausal status.    Exercising: Yes.    walking Smoker:  no  Health Maintenance: Pap:   07-29-17 neg HPV HR neg 06/17/15 WNL HR HPV Neg  History of abnormal Pap:  Yes, ago per patient (~2001) MMG:  09/07/18 Bi-rads 2 benign  BMD:  Never  Colonoscopy:2016 possible serous adenoma polyp. F/u 31yrs  TDaP: March, 2012   reports that she has never smoked. She has never used smokeless tobacco. She reports current alcohol use of about 4.0 standard drinks of alcohol per week. 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 Abnormal pap 09/01   CIN1, II HR HPV  . Depression   . Dyspareunia 03/04   PUS (neg)  . Echocardiogram abnormal about 1998   physiological mitral & tricuspid regurge, but no MVP, No SBE prophylaxis  . GERD (gastroesophageal reflux disease) 10/04  . H/O tension headache 09/01   family stressors  . HSV (herpes simplex virus) anogenital infection hx  . Hypertension   . IBS (irritable bowel syndrome)   . IC (interstitial cystitis) 11/2002   Dr. Amalia Hailey  . Rotator cuff injury    partial tear  left arm  . Squamous cell carcinoma of leg, left    clear margins  . STD (sexually transmitted disease) 11/30/99, 12/06   HSV, 12/06  . TMJ (temporomandibular joint syndrome) 2004    Past Surgical History:  Procedure Laterality Date  . AUGMENTATION MAMMAPLASTY  01/08   reconstruction  . COLPOSCOPY  10/01   no lesion  . FOOT SURGERY  2012   right   . KNEE ARTHROPLASTY  1998   left knee reconstruction  . ROTATOR CUFF REPAIR  2019   1/19 left arm, 11/19 right arm  . SQUAMOUS CELL CARCINOMA EXCISION     left leg 07/2018  . VAGINA SURGERY  1982   rectal/vaginal repair post partum    Current Outpatient Medications  Medication Sig Dispense Refill  . amLODipine (NORVASC) 2.5 MG tablet Take by mouth.    . calcium carbonate (OSCAL) 1500 (600 Ca) MG TABS tablet Take by mouth.    . Cholecalciferol (VITAMIN D3) 1000000 UNIT/GM LIQD by Does not apply route.    . clonazePAM (KLONOPIN) 0.5 MG tablet Take by mouth.    . Cyanocobalamin (B-12 PO) Take by mouth.    . EVENING PRIMROSE OIL PO Take by mouth. Take 1300mg  once daily    . fluticasone (FLONASE) 50 MCG/ACT nasal spray SPRAY 2 SPRAYS INTO EACH NOSTRIL EVERY DAY  2  . hyoscyamine (  LEVBID) 0.375 MG 12 hr tablet Take by mouth.    . latanoprost (XALATAN) 0.005 % ophthalmic solution Place 1 drop into both eyes at bedtime.     Marland Kitchen loratadine (CLARITIN) 10 MG tablet Take 10 mg by mouth daily.    . Magnesium 250 MG TABS Take by mouth.    . naproxen (NAPROSYN) 500 MG tablet Take by mouth.    . Omega-3 Fatty Acids (FISH OIL) 1000 MG CAPS Take by mouth daily.    Marland Kitchen omeprazole (PRILOSEC) 20 MG capsule Take 40 mg by mouth daily.     Marland Kitchen PREMPRO 0.3-1.5 MG tablet TAKE 1 TABLET BY MOUTH EVERY DAY 84 tablet 3  . valACYclovir (VALTREX) 500 MG tablet TAKE 1 TABLET BY MOUTH TWICE A DAY.  TAKE FOR 3 DAYS AS NEEDED FOR AN OUTBREAK. 180 tablet 1  . escitalopram (LEXAPRO) 10 MG tablet TAKE 1 TABLET BY MOUTH EVERY DAY (Patient not taking: Reported on 08/09/2019) 90  tablet 0   No current facility-administered medications for this visit.    Family History  Problem Relation Age of Onset  . Cervical cancer Mother   . Scoliosis Mother   . Diabetes Mother 19  . Cervical cancer Maternal Grandmother   . Lung cancer Maternal Grandfather     Review of Systems  Constitutional: Negative.   HENT: Negative.   Eyes: Negative.   Respiratory: Negative.   Cardiovascular: Negative.   Gastrointestinal: Negative.   Endocrine: Negative.   Genitourinary: Negative.   Musculoskeletal: Negative.   Skin: Negative.   Allergic/Immunologic: Negative.   Neurological: Negative.   Hematological: Negative.   Psychiatric/Behavioral: Negative.     Exam:   BP 110/62 (BP Location: Right Arm, Patient Position: Sitting, Cuff Size: Normal)   Pulse 76   Temp (!) 97.3 F (36.3 C) (Temporal)   Ht 5' 4.5" (1.638 m)   Wt 156 lb (70.8 kg)   LMP 12/05/2013 (Approximate)   BMI 26.36 kg/m   Weight change: @WEIGHTCHANGE @ Height:   Height: 5' 4.5" (163.8 cm)  Ht Readings from Last 3 Encounters:  08/09/19 5' 4.5" (1.638 m)  08/02/18 5\' 4"  (1.626 m)  10/10/17 5' 3.75" (1.619 m)    General appearance: alert, cooperative and appears stated age Head: Normocephalic, without obvious abnormality, atraumatic Neck: no adenopathy, supple, symmetrical, trachea midline and thyroid normal to inspection and palpation Lungs: clear to auscultation bilaterally Cardiovascular: regular rate and rhythm Breasts: normal appearance, no masses or tenderness Abdomen: soft, non-tender; non distended,  no masses,  no organomegaly. With tensing her abdominal muscles she is tender in the right lateral, upper abdominal wall Extremities: extremities normal, atraumatic, no cyanosis or edema Skin: Skin color, texture, turgor normal. No rashes or lesions Lymph nodes: Cervical, supraclavicular, and axillary nodes normal. No abnormal inguinal nodes palpated Neurologic: Grossly normal   Pelvic: External  genitalia:  no lesions              Urethra:  normal appearing urethra with no masses, tenderness or lesions              Bartholins and Skenes: normal                 Vagina: mildly atrophic appearing vagina with normal color and discharge, no lesions (no abnormalities noted on the vaginal wall)              Cervix: no lesions               Bimanual Exam:  Uterus:  normal size, contour, position, consistency, mobility, non-tender              Adnexa: no mass, fullness, tenderness               Rectovaginal: Confirms               Anus:  normal sphincter tone, no lesions  Terence Lux chaperoned for the exam.  A:  Well Woman with normal exam  H/O HSV, on suppression  Dyspareunia  Mild vaginal atrophy  On HRT, wants to continue  Abdominal wall pain, muscular  P:   Pap next year  Mammogram next month  Colonoscopy due  Discussed breast self exam  Discussed calcium and vit D intake  Continue HRT (aware of risk)  Start vaginal estrogen tablets  Given information on muscular pain, offered referral to PT. She will let me know if she wants this.

## 2019-08-09 ENCOUNTER — Encounter: Payer: Self-pay | Admitting: Obstetrics and Gynecology

## 2019-08-09 ENCOUNTER — Ambulatory Visit (INDEPENDENT_AMBULATORY_CARE_PROVIDER_SITE_OTHER): Payer: BC Managed Care – PPO | Admitting: Obstetrics and Gynecology

## 2019-08-09 VITALS — BP 110/62 | HR 76 | Temp 97.3°F | Ht 64.5 in | Wt 156.0 lb

## 2019-08-09 DIAGNOSIS — Z01419 Encounter for gynecological examination (general) (routine) without abnormal findings: Secondary | ICD-10-CM

## 2019-08-09 DIAGNOSIS — N941 Unspecified dyspareunia: Secondary | ICD-10-CM | POA: Diagnosis not present

## 2019-08-09 DIAGNOSIS — N951 Menopausal and female climacteric states: Secondary | ICD-10-CM

## 2019-08-09 DIAGNOSIS — Z79890 Hormone replacement therapy (postmenopausal): Secondary | ICD-10-CM | POA: Diagnosis not present

## 2019-08-09 DIAGNOSIS — I10 Essential (primary) hypertension: Secondary | ICD-10-CM | POA: Insufficient documentation

## 2019-08-09 MED ORDER — ESTRADIOL 10 MCG VA TABS: ORAL_TABLET | VAGINAL | 3 refills | Status: DC

## 2019-08-09 MED ORDER — PREMPRO 0.3-1.5 MG PO TABS: 1.0000 | ORAL_TABLET | Freq: Every day | ORAL | 3 refills | Status: DC

## 2019-08-09 NOTE — Patient Instructions (Addendum)
EXERCISE AND DIET:  We recommended that you start or continue a regular exercise program for good health. Regular exercise means any activity that makes your heart beat faster and makes you sweat.  We recommend exercising at least 30 minutes per day at least 3 days a week, preferably 4 or 5.  We also recommend a diet low in fat and sugar.  Inactivity, poor dietary choices and obesity can cause diabetes, heart attack, stroke, and kidney damage, among others.    ALCOHOL AND SMOKING:  Women should limit their alcohol intake to no more than 7 drinks/beers/glasses of wine (combined, not each!) per week. Moderation of alcohol intake to this level decreases your risk of breast cancer and liver damage. And of course, no recreational drugs are part of a healthy lifestyle.  And absolutely no smoking or even second hand smoke. Most people know smoking can cause heart and lung diseases, but did you know it also contributes to weakening of your bones? Aging of your skin?  Yellowing of your teeth and nails?  CALCIUM AND VITAMIN D:  Adequate intake of calcium and Vitamin D are recommended.  The recommendations for exact amounts of these supplements seem to change often, but generally speaking 1,200 mg of calcium (between diet and supplement) and 800 units of Vitamin D per day seems prudent. Certain women may benefit from higher intake of Vitamin D.  If you are among these women, your doctor will have told you during your visit.    PAP SMEARS:  Pap smears, to check for cervical cancer or precancers,  have traditionally been done yearly, although recent scientific advances have shown that most women can have pap smears less often.  However, every woman still should have a physical exam from her gynecologist every year. It will include a breast check, inspection of the vulva and vagina to check for abnormal growths or skin changes, a visual exam of the cervix, and then an exam to evaluate the size and shape of the uterus and  ovaries.  And after 56 years of age, a rectal exam is indicated to check for rectal cancers. We will also provide age appropriate advice regarding health maintenance, like when you should have certain vaccines, screening for sexually transmitted diseases, bone density testing, colonoscopy, mammograms, etc.   MAMMOGRAMS:  All women over 40 years old should have a yearly mammogram. Many facilities now offer a "3D" mammogram, which may cost around $50 extra out of pocket. If possible,  we recommend you accept the option to have the 3D mammogram performed.  It both reduces the number of women who will be called back for extra views which then turn out to be normal, and it is better than the routine mammogram at detecting truly abnormal areas.    COLON CANCER SCREENING: Now recommend starting at age 45. At this time colonoscopy is not covered for routine screening until 50. There are take home tests that can be done between 45-49.   COLONOSCOPY:  Colonoscopy to screen for colon cancer is recommended for all women at age 50.  We know, you hate the idea of the prep.  We agree, BUT, having colon cancer and not knowing it is worse!!  Colon cancer so often starts as a polyp that can be seen and removed at colonscopy, which can quite literally save your life!  And if your first colonoscopy is normal and you have no family history of colon cancer, most women don't have to have it again for   10 years.  Once every ten years, you can do something that may end up saving your life, right?  We will be happy to help you get it scheduled when you are ready.  Be sure to check your insurance coverage so you understand how much it will cost.  It may be covered as a preventative service at no cost, but you should check your particular policy.      Breast Self-Awareness Breast self-awareness means being familiar with how your breasts look and feel. It involves checking your breasts regularly and reporting any changes to your  health care provider. Practicing breast self-awareness is important. A change in your breasts can be a sign of a serious medical problem. Being familiar with how your breasts look and feel allows you to find any problems early, when treatment is more likely to be successful. All women should practice breast self-awareness, including women who have had breast implants. How to do a breast self-exam One way to learn what is normal for your breasts and whether your breasts are changing is to do a breast self-exam. To do a breast self-exam: Look for Changes  1. Remove all the clothing above your waist. 2. Stand in front of a mirror in a room with good lighting. 3. Put your hands on your hips. 4. Push your hands firmly downward. 5. Compare your breasts in the mirror. Look for differences between them (asymmetry), such as: ? Differences in shape. ? Differences in size. ? Puckers, dips, and bumps in one breast and not the other. 6. Look at each breast for changes in your skin, such as: ? Redness. ? Scaly areas. 7. Look for changes in your nipples, such as: ? Discharge. ? Bleeding. ? Dimpling. ? Redness. ? A change in position. Feel for Changes Carefully feel your breasts for lumps and changes. It is best to do this while lying on your back on the floor and again while sitting or standing in the shower or tub with soapy water on your skin. Feel each breast in the following way:  Place the arm on the side of the breast you are examining above your head.  Feel your breast with the other hand.  Start in the nipple area and make  inch (2 cm) overlapping circles to feel your breast. Use the pads of your three middle fingers to do this. Apply light pressure, then medium pressure, then firm pressure. The light pressure will allow you to feel the tissue closest to the skin. The medium pressure will allow you to feel the tissue that is a little deeper. The firm pressure will allow you to feel the tissue  close to the ribs.  Continue the overlapping circles, moving downward over the breast until you feel your ribs below your breast.  Move one finger-width toward the center of the body. Continue to use the  inch (2 cm) overlapping circles to feel your breast as you move slowly up toward your collarbone.  Continue the up and down exam using all three pressures until you reach your armpit.  Write Down What You Find  Write down what is normal for each breast and any changes that you find. Keep a written record with breast changes or normal findings for each breast. By writing this information down, you do not need to depend only on memory for size, tenderness, or location. Write down where you are in your menstrual cycle, if you are still menstruating. If you are having trouble noticing differences   in your breasts, do not get discouraged. With time you will become more familiar with the variations in your breasts and more comfortable with the exam. How often should I examine my breasts? Examine your breasts every month. If you are breastfeeding, the best time to examine your breasts is after a feeding or after using a breast pump. If you menstruate, the best time to examine your breasts is 5-7 days after your period is over. During your period, your breasts are lumpier, and it may be more difficult to notice changes. When should I see my health care provider? See your health care provider if you notice:  A change in shape or size of your breasts or nipples.  A change in the skin of your breast or nipples, such as a reddened or scaly area.  Unusual discharge from your nipples.  A lump or thick area that was not there before.  Pain in your breasts.  Anything that concerns you.  Muscle Pain, Adult Muscle pain (myalgia) may be mild or severe. In most cases, the pain lasts only a short time and it goes away without treatment. It is normal to feel some muscle pain after starting a workout  program. Muscles that have not been used often will be sore at first. Muscle pain may also be caused by many other things, including:  Overuse or muscle strain, especially if you are not in shape. This is the most common cause of muscle pain.  Injury.  Bruises.  Viruses, such as the flu.  Infectious diseases.  A chronic condition that causes muscle tenderness, fatigue, and headache (fibromyalgia).  A condition, such as lupus, in which the body's disease-fighting system attacks other organs in the body (autoimmune or rheumatologic diseases).  Certain drugs, including ACE inhibitors and statins. To diagnose the cause of your muscle pain, your health care provider will do a physical exam and ask questions about the pain and when it began. If you have not had muscle pain for very long, your health care provider may want to wait before doing much testing. If your muscle pain has lasted a long time, your health care provider may want to run tests right away. In some cases, this may include tests to rule out certain conditions or illnesses. Treatment for muscle pain depends on the cause. Home care is often enough to relieve muscle pain. Your health care provider may also prescribe anti-inflammatory medicine. Follow these instructions at home: Activity  If overuse is causing your muscle pain: ? Slow down your activities until the pain goes away. ? Do regular, gentle exercises if you are not usually active. ? Warm up before exercising. Stretch before and after exercising. This can help lower the risk of muscle pain.  Do not continue working out if the pain is very bad. Bad pain could mean that you have injured a muscle. Managing pain and discomfort   If directed, apply ice to the sore muscle: ? Put ice in a plastic bag. ? Place a towel between your skin and the bag. ? Leave the ice on for 20 minutes, 2-3 times a day.  You may also alternate between applying ice and applying heat as told  by your health care provider. To apply heat, use the heat source that your health care provider recommends, such as a moist heat pack or a heating pad. ? Place a towel between your skin and the heat source. ? Leave the heat on for 20-30 minutes. ? Remove the heat  if your skin turns bright red. This is especially important if you are unable to feel pain, heat, or cold. You may have a greater risk of getting burned. Medicines  Take over-the-counter and prescription medicines only as told by your health care provider.  Do not drive or use heavy machinery while taking prescription pain medicine. Contact a health care provider if:  Your muscle pain gets worse and medicines do not help.  You have muscle pain that lasts longer than 3 days.  You have a rash or fever along with muscle pain.  You have muscle pain after a tick bite.  You have muscle pain while working out, even though you are in good physical condition.  You have redness, soreness, or swelling along with muscle pain.  You have muscle pain after starting a new medicine or changing the dose of a medicine. Get help right away if:  You have trouble breathing.  You have trouble swallowing.  You have muscle pain along with a stiff neck, fever, and vomiting.  You have severe muscle weakness or cannot move part of your body. This information is not intended to replace advice given to you by your health care provider. Make sure you discuss any questions you have with your health care provider. Document Revised: 01/14/2017 Document Reviewed: 06/24/2015 Elsevier Patient Education  [place].

## 2020-08-13 ENCOUNTER — Encounter: Payer: Self-pay | Admitting: Obstetrics and Gynecology

## 2020-08-13 ENCOUNTER — Other Ambulatory Visit: Payer: Self-pay

## 2020-08-13 ENCOUNTER — Ambulatory Visit (INDEPENDENT_AMBULATORY_CARE_PROVIDER_SITE_OTHER): Payer: BC Managed Care – PPO | Admitting: Obstetrics and Gynecology

## 2020-08-13 ENCOUNTER — Other Ambulatory Visit (HOSPITAL_COMMUNITY)
Admission: RE | Admit: 2020-08-13 | Discharge: 2020-08-13 | Disposition: A | Discharge status: Home / Self Care | Payer: BC Managed Care – PPO | Source: Ambulatory Visit | Attending: Obstetrics and Gynecology | Admitting: Obstetrics and Gynecology

## 2020-08-13 VITALS — BP 124/66 | HR 67 | Ht 65.0 in | Wt 137.0 lb

## 2020-08-13 DIAGNOSIS — Z23 Encounter for immunization: Secondary | ICD-10-CM | POA: Diagnosis not present

## 2020-08-13 DIAGNOSIS — Z124 Encounter for screening for malignant neoplasm of cervix: Secondary | ICD-10-CM

## 2020-08-13 DIAGNOSIS — B9689 Other specified bacterial agents as the cause of diseases classified elsewhere: Secondary | ICD-10-CM

## 2020-08-13 DIAGNOSIS — N898 Other specified noninflammatory disorders of vagina: Secondary | ICD-10-CM

## 2020-08-13 DIAGNOSIS — Z79890 Hormone replacement therapy (postmenopausal): Secondary | ICD-10-CM | POA: Diagnosis not present

## 2020-08-13 DIAGNOSIS — N952 Postmenopausal atrophic vaginitis: Secondary | ICD-10-CM | POA: Diagnosis not present

## 2020-08-13 DIAGNOSIS — N760 Acute vaginitis: Secondary | ICD-10-CM

## 2020-08-13 DIAGNOSIS — A6000 Herpesviral infection of urogenital system, unspecified: Secondary | ICD-10-CM

## 2020-08-13 DIAGNOSIS — Z01419 Encounter for gynecological examination (general) (routine) without abnormal findings: Secondary | ICD-10-CM

## 2020-08-13 LAB — WET PREP FOR TRICH, YEAST, CLUE

## 2020-08-13 MED ORDER — PREMPRO 0.3-1.5 MG PO TABS: 1.0000 | ORAL_TABLET | Freq: Every day | ORAL | 3 refills | Status: DC

## 2020-08-13 MED ORDER — METRONIDAZOLE 500 MG PO TABS: 500.0000 mg | ORAL_TABLET | Freq: Two times a day (BID) | ORAL | 0 refills | Status: DC

## 2020-08-13 NOTE — Progress Notes (Signed)
55 y.o. [id number] Married White or Caucasian Not Hispanic or Latino female here for annual exam.  On HRT, doing well. Vasomotor symptoms, mood changes are improved.  She has lost 40 lbs in the last 1.5 years, changed her diet, exercising.   Last year she tried vaginal estrogen for a week, she felt it made her void very frequently at night. She still has the tablets.  She has some entry dyspareunia. Slight help with lubrication.   H/O HSV, on suppression. She will call if she needs a refill (got it from her primary)  She c/o a one month h/o a slight increase in white vaginal d/c with an odor.     Patient's last menstrual period was 12/05/2013 (approximate).          Sexually active: No.  The current method of family planning is post menopausal status.    Exercising: Yes.     Walking  Smoker:  no  Health Maintenance: Pap:  07-29-17 neg HPV HR neg 06/17/15 WNL HR HPV Neg  History of abnormal Pap:  Yes, ago per patient (~2001) MMG:  12/10/19 Bi-rads 1 neg  BMD:   none  Colonoscopy: 07/30/14 possible serous adenoma polyp. F/u in 2021, small polyp. F/U in 5 years.  TDaP:  04/30/10  Gardasil: Na   reports that she has never smoked. She has never used smokeless tobacco. She reports current alcohol use of about 4.0 standard drinks of alcohol per week. She reports that she does not use drugs.Glass blower/designer. 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 [place]   Past Medical History:  Diagnosis Date   Abnormal pap 09/01   CIN1, II HR HPV   Depression    Dyspareunia 03/04   PUS (neg)   Echocardiogram abnormal about 1998   physiological mitral & tricuspid regurge, but no MVP, No SBE prophylaxis   GERD (gastroesophageal reflux disease) 10/04   H/O tension headache 09/01   family stressors   HSV (herpes simplex virus) anogenital infection hx   Hypertension    Hypertension    IBS (irritable bowel syndrome)    IC (interstitial cystitis) 11/2002   Dr. Amalia Hailey   Rotator cuff injury     partial tear left arm   Squamous cell carcinoma of leg, left    clear margins   STD (sexually transmitted disease) 11/30/99, 12/06   HSV, 12/06   TMJ (temporomandibular joint syndrome) 2004    Past Surgical History:  Procedure Laterality Date   AUGMENTATION MAMMAPLASTY  01/08   reconstruction   COLPOSCOPY  10/01   no lesion   FOOT SURGERY  2012   right    KNEE ARTHROPLASTY  1998   left knee reconstruction   ROTATOR CUFF REPAIR  2019   1/19 left arm, 11/19 right arm   SQUAMOUS CELL CARCINOMA EXCISION     left leg 07/2018   VAGINA SURGERY  1982   rectal/vaginal repair post partum    Current Outpatient Medications  Medication Sig Dispense Refill   Cholecalciferol (VITAMIN D3) 1000000 UNIT/GM LIQD by Does not apply route.     clonazePAM (KLONOPIN) 0.5 MG tablet Take by mouth.     Cyanocobalamin (B-12 PO) Take by mouth.     estrogen, conjugated,-medroxyprogesterone (PREMPRO) 0.3-1.5 MG tablet Take 1 tablet by mouth daily. 84 tablet 3   EVENING PRIMROSE OIL PO Take by mouth. Take 1300mg  once daily     fluticasone (FLONASE) 50 MCG/ACT nasal spray SPRAY 2 SPRAYS INTO EACH NOSTRIL  EVERY DAY  2   hyoscyamine (LEVBID) 0.375 MG 12 hr tablet Take by mouth.     latanoprost (XALATAN) 0.005 % ophthalmic solution Place 1 drop into both eyes at bedtime.      loratadine (CLARITIN) 10 MG tablet Take 10 mg by mouth daily.     naproxen (NAPROSYN) 500 MG tablet Take by mouth.     Omega-3 Fatty Acids (FISH OIL) 1000 MG CAPS Take by mouth daily.     omeprazole (PRILOSEC) 20 MG capsule Take 40 mg by mouth daily.      valACYclovir (VALTREX) 500 MG tablet TAKE 1 TABLET BY MOUTH TWICE A DAY.  TAKE FOR 3 DAYS AS NEEDED FOR AN OUTBREAK. 180 tablet 1   No current facility-administered medications for this visit.    Family History  Problem Relation Age of Onset   Cervical cancer Mother    Scoliosis Mother    Diabetes Mother 62   Cervical cancer Maternal Grandmother    Lung cancer Maternal  Grandfather     Review of Systems  Genitourinary:  Positive for vaginal discharge.  All other systems reviewed and are negative.  Exam:   BP 124/66   Pulse 67   Ht 5\' 5"  (1.651 m)   Wt 137 lb (62.1 kg)   LMP 12/05/2013 (Approximate)   SpO2 98%   BMI 22.80 kg/m   Weight change: @WEIGHTCHANGE @ Height:   Height: 5\' 5"  (165.1 cm)  Ht Readings from Last 3 Encounters:  08/13/20 5\' 5"  (1.651 m)  08/09/19 5' 4.5" (1.638 m)  08/02/18 5\' 4"  (1.626 m)    General appearance: alert, cooperative and appears stated age Head: Normocephalic, without obvious abnormality, atraumatic Neck: no adenopathy, supple, symmetrical, trachea midline and thyroid normal to inspection and palpation Lungs: clear to auscultation bilaterally Cardiovascular: regular rate and rhythm Breasts: normal appearance, no masses or tenderness, bilateral implants Abdomen: soft, non-tender; non distended,  no masses,  no organomegaly Extremities: extremities normal, atraumatic, no cyanosis or edema Skin: Skin color, texture, turgor normal. No rashes or lesions Lymph nodes: Cervical, supraclavicular, and axillary nodes normal. No abnormal inguinal nodes palpated Neurologic: Grossly normal   Pelvic: External genitalia:  no lesions              Urethra:  normal appearing urethra with no masses, tenderness or lesions              Bartholins and Skenes: normal                 Vagina: mildly atrophic appearing vagina with a slight increase in thin, frothy, white vaginal d/c              Cervix: no lesions and stenotic               Bimanual Exam:  Uterus:  normal size, contour, position, consistency, mobility, non-tender              Adnexa: no mass, fullness, tenderness               Rectovaginal: Confirms               Anus:  normal sphincter tone, no lesions  Gae Dry chaperoned for the exam.  1. Well woman exam Discussed breast self exam Discussed calcium and vit D intake Mammogram and colonoscopy UTD Labs  with primary  2. Hormone replacement therapy (HRT) Doing well, wants to continue, aware of risks - estrogen, conjugated,-medroxyprogesterone (PREMPRO) 0.3-1.5 MG tablet; Take 1 tablet  by mouth daily.  Dispense: 84 tablet; Refill: 3  3. Herpes simplex infection of genitourinary system She will call if she needs a refill of her Valtrex  4. Vaginal atrophy She will retry the vaginal estrogen and will call if she wants a refill  5. Screening for cervical cancer - Cytology - PAP  6. Immunization due - Tdap vaccine greater than or equal to 7yo IM  7. Vaginal discharge - WET PREP FOR [place], YEAST, CLUE: + clue  8. Bacterial vaginitis Discussed option of oral or vaginal flagyl. Desires oral, aware no ETOH with flagyl - metroNIDAZOLE (FLAGYL) 500 MG tablet; Take 1 tablet (500 mg total) by mouth 2 (two) times daily.  Dispense: 14 tablet; Refill: 0

## 2020-08-13 NOTE — Patient Instructions (Signed)

## 2020-08-20 LAB — CYTOLOGY - PAP
Adequacy: ABSENT
Comment: NEGATIVE
Diagnosis: NEGATIVE
Diagnosis: REACTIVE
High risk HPV: NEGATIVE

## 2021-07-30 ENCOUNTER — Ambulatory Visit: Payer: BC Managed Care – PPO | Admitting: Obstetrics and Gynecology

## 2021-07-30 ENCOUNTER — Encounter: Payer: Self-pay | Admitting: Obstetrics and Gynecology

## 2021-07-30 VITALS — BP 110/64 | HR 67 | Temp 97.9°F | Wt 144.0 lb

## 2021-07-30 DIAGNOSIS — N3090 Cystitis, unspecified without hematuria: Secondary | ICD-10-CM

## 2021-07-30 LAB — URINALYSIS, COMPLETE
Bilirubin Urine: NEGATIVE
Glucose, UA: NEGATIVE
Hgb urine dipstick: NEGATIVE
Hyaline Cast: NONE SEEN /LPF
Ketones, ur: NEGATIVE
Leukocytes,Ua: NEGATIVE
Nitrite: NEGATIVE
Protein, ur: NEGATIVE
RBC / HPF: NONE SEEN /HPF (ref 0–2)
Specific Gravity, Urine: 1.01 (ref 1.001–1.035)
pH: 6 (ref 5.0–8.0)

## 2021-07-30 MED ORDER — SULFAMETHOXAZOLE-TRIMETHOPRIM 800-160 MG PO TABS: 1.0000 | ORAL_TABLET | Freq: Two times a day (BID) | ORAL | 0 refills | Status: DC

## 2021-07-30 MED ORDER — PHENAZOPYRIDINE HCL 200 MG PO TABS: 200.0000 mg | ORAL_TABLET | Freq: Three times a day (TID) | ORAL | 0 refills | Status: DC

## 2021-07-30 NOTE — Progress Notes (Signed)
GYNECOLOGY  VISIT   HPI: 56 y.o.   Married White or Caucasian Not Hispanic or Latino  female   [phone] with Patient's last menstrual period was 12/05/2013 (approximate).   here for uti symptoms.  She is having bladder pressure, urinary frequency, and urgency since Sunday. She states that she was experiencing dysuria but that has subsided.  No fevers, she has chronic back pain.   GYNECOLOGIC HISTORY: Patient's last menstrual period was 12/05/2013 (approximate). Contraception:PMP Menopausal hormone therapy: yes  Prempro         OB History     Gravida  3   Para  1   Term  1   Preterm  0   AB  2   Living  1      SAB  0   IAB  0   Ectopic  0   Multiple  0   Live Births  1              Patient Active Problem List   Diagnosis Date Noted   Hypertension    Herpes simplex infection of genitourinary system 09/08/2018    Past Medical History:  Diagnosis Date   Abnormal pap 09/01   CIN1, II HR HPV   Depression    Dyspareunia 03/04   PUS (neg)   Echocardiogram abnormal about 1998   physiological mitral & tricuspid regurge, but no MVP, No SBE prophylaxis   GERD (gastroesophageal reflux disease) 10/04   H/O tension headache 09/01   family stressors   HSV (herpes simplex virus) anogenital infection hx   Hypertension    Hypertension    IBS (irritable bowel syndrome)    IC (interstitial cystitis) 11/2002   Dr. Amalia Hailey   Rotator cuff injury    partial tear left arm   Squamous cell carcinoma of leg, left    clear margins   STD (sexually transmitted disease) 11/30/99, 12/06   HSV, 12/06   TMJ (temporomandibular joint syndrome) 2004    Past Surgical History:  Procedure Laterality Date   AUGMENTATION MAMMAPLASTY  01/08   reconstruction   COLPOSCOPY  10/01   no lesion   FOOT SURGERY  2012   right    KNEE ARTHROPLASTY  1998   left knee reconstruction   ROTATOR CUFF REPAIR  2019   1/19 left arm, 11/19 right arm   SQUAMOUS CELL CARCINOMA EXCISION     left  leg 07/2018   VAGINA SURGERY  1982   rectal/vaginal repair post partum    Current Outpatient Medications  Medication Sig Dispense Refill   Cholecalciferol (VITAMIN D3) 1000000 UNIT/GM LIQD by Does not apply route.     clonazePAM (KLONOPIN) 0.5 MG tablet Take by mouth.     Cyanocobalamin (B-12 PO) Take by mouth.     estrogen, conjugated,-medroxyprogesterone (PREMPRO) 0.3-1.5 MG tablet Take 1 tablet by mouth daily. 84 tablet 3   EVENING PRIMROSE OIL PO Take by mouth. Take '1300mg'$  once daily     fluticasone (FLONASE) 50 MCG/ACT nasal spray SPRAY 2 SPRAYS INTO EACH NOSTRIL EVERY DAY  2   hyoscyamine (LEVBID) 0.375 MG 12 hr tablet Take by mouth.     latanoprost (XALATAN) 0.005 % ophthalmic solution Place 1 drop into both eyes at bedtime.      loratadine (CLARITIN) 10 MG tablet Take 10 mg by mouth daily.     naproxen (NAPROSYN) 500 MG tablet Take by mouth.     Omega-3 Fatty Acids (FISH OIL) 1000 MG CAPS Take by mouth daily.  omeprazole (PRILOSEC) 20 MG capsule Take 40 mg by mouth daily.      valACYclovir (VALTREX) 500 MG tablet TAKE 1 TABLET BY MOUTH TWICE A DAY.  TAKE FOR 3 DAYS AS NEEDED FOR AN OUTBREAK. 180 tablet 1   No current facility-administered medications for this visit.     ALLERGIES: Patient has no known allergies.  Family History  Problem Relation Age of Onset   Cervical cancer Mother    Scoliosis Mother    Diabetes Mother 71   Cervical cancer Maternal Grandmother    Lung cancer Maternal Grandfather     Social History   Socioeconomic History   Marital status: Married    Spouse name: Not on file   Number of children: Not on file   Years of education: Not on file   Highest education level: Not on file  Occupational History   Not on file  Tobacco Use   Smoking status: Never   Smokeless tobacco: Never  Vaping Use   Vaping Use: Never used  Substance and Sexual Activity   Alcohol use: Yes    Alcohol/week: 4.0 standard drinks of alcohol    Types: 4 Standard  drinks or equivalent per week   Drug use: No   Sexual activity: Not Currently    Partners: Male    Comment: husband vasectomy  Other Topics Concern   Not on file  Social History Narrative   Not on file   Social Determinants of Health   Financial Resource Strain: Not on file  Food Insecurity: Not on file  Transportation Needs: Not on file  Physical Activity: Not on file  Stress: Not on file  Social Connections: Not on file  Intimate Partner Violence: Not on file    Review of Systems  All other systems reviewed and are negative.   PHYSICAL EXAMINATION:    BP 110/64   Pulse 67   Temp 97.9 F (36.6 C)   Wt 144 lb (65.3 kg)   LMP 12/05/2013 (Approximate)   SpO2 100%   BMI 23.96 kg/m     General appearance: alert, cooperative and appears stated age CVA: not tender Abdomen: soft, minimally tender in the SP region; non distended, no masses,  no organomegaly  1. Cystitis - Urinalysis, Complete - Urine Culture - sulfamethoxazole-trimethoprim (BACTRIM DS) 800-160 MG tablet; Take 1 tablet by mouth 2 (two) times daily. One PO BID x 3 days  Dispense: 6 tablet; Refill: 0 - phenazopyridine (PYRIDIUM) 200 MG tablet; Take 1 tablet (200 mg total) by mouth 3 (three) times daily with meals.  Dispense: 6 tablet; Refill: 0

## 2021-07-30 NOTE — Patient Instructions (Signed)
Urinary Tract Infection, Adult  A urinary tract infection (UTI) is an infection of any part of the urinary tract. The urinary tract includes the kidneys, ureters, bladder, and urethra. These organs make, store, and get rid of urine in the body. An upper UTI affects the ureters and kidneys. A lower UTI affects the bladder and urethra. What are the causes? Most urinary tract infections are caused by bacteria in your genital area around your urethra, where urine leaves your body. These bacteria grow and cause inflammation of your urinary tract. What increases the risk? You are more likely to develop this condition if: You have a urinary catheter that stays in place. You are not able to control when you urinate or have a bowel movement (incontinence). You are female and you: Use a spermicide or diaphragm for birth control. Have low estrogen levels. Are pregnant. You have certain genes that increase your risk. You are sexually active. You take antibiotic medicines. You have a condition that causes your flow of urine to slow down, such as: An enlarged prostate, if you are female. Blockage in your urethra. A kidney stone. A nerve condition that affects your bladder control (neurogenic bladder). Not getting enough to drink, or not urinating often. You have certain medical conditions, such as: Diabetes. A weak disease-fighting system (immunesystem). Sickle cell disease. Gout. Spinal cord injury. What are the signs or symptoms? Symptoms of this condition include: Needing to urinate right away (urgency). Frequent urination. This may include small amounts of urine each time you urinate. Pain or burning with urination. Blood in the urine. Urine that smells bad or unusual. Trouble urinating. Cloudy urine. Vaginal discharge, if you are female. Pain in the abdomen or the lower back. You may also have: Vomiting or a decreased appetite. Confusion. Irritability or tiredness. A fever or  chills. Diarrhea. The first symptom in older adults may be confusion. In some cases, they may not have any symptoms until the infection has worsened. How is this diagnosed? This condition is diagnosed based on your medical history and a physical exam. You may also have other tests, including: Urine tests. Blood tests. Tests for STIs (sexually transmitted infections). If you have had more than one UTI, a cystoscopy or imaging studies may be done to determine the cause of the infections. How is this treated? Treatment for this condition includes: Antibiotic medicine. Over-the-counter medicines to treat discomfort. Drinking enough water to stay hydrated. If you have frequent infections or have other conditions such as a kidney stone, you may need to see a health care provider who specializes in the urinary tract (urologist). In rare cases, urinary tract infections can cause sepsis. Sepsis is a life-threatening condition that occurs when the body responds to an infection. Sepsis is treated in the hospital with IV antibiotics, fluids, and other medicines. Follow these instructions at home:  Medicines Take over-the-counter and prescription medicines only as told by your health care provider. If you were prescribed an antibiotic medicine, take it as told by your health care provider. Do not stop using the antibiotic even if you start to feel better. General instructions Make sure you: Empty your bladder often and completely. Do not hold urine for long periods of time. Empty your bladder after sex. Wipe from front to back after urinating or having a bowel movement if you are female. Use each tissue only one time when you wipe. Drink enough fluid to keep your urine pale yellow. Keep all follow-up visits. This is important. Contact a health   care provider if: Your symptoms do not get better after 1-2 days. Your symptoms go away and then return. Get help right away if: You have severe pain in  your back or your lower abdomen. You have a fever or chills. You have nausea or vomiting. Summary A urinary tract infection (UTI) is an infection of any part of the urinary tract, which includes the kidneys, ureters, bladder, and urethra. Most urinary tract infections are caused by bacteria in your genital area. Treatment for this condition often includes antibiotic medicines. If you were prescribed an antibiotic medicine, take it as told by your health care provider. Do not stop using the antibiotic even if you start to feel better. Keep all follow-up visits. This is important. This information is not intended to replace advice given to you by your health care provider. Make sure you discuss any questions you have with your health care provider. Document Revised: 09/14/2019 Document Reviewed: 09/14/2019 Elsevier Patient Education  2023 Elsevier Inc.  

## 2021-07-31 LAB — URINE CULTURE
MICRO NUMBER:: 13530478
Result:: NO GROWTH
SPECIMEN QUALITY:: ADEQUATE

## 2021-08-01 ENCOUNTER — Encounter: Payer: Self-pay | Admitting: Obstetrics and Gynecology

## 2021-08-20 ENCOUNTER — Other Ambulatory Visit: Payer: Self-pay | Admitting: Obstetrics and Gynecology

## 2021-08-20 DIAGNOSIS — Z79890 Hormone replacement therapy (postmenopausal): Secondary | ICD-10-CM

## 2021-08-20 NOTE — Telephone Encounter (Signed)
Last AEX 08/13/20--scheduled for 09/01/21. Last mammo 06/24/21-birads 2 benign

## 2021-08-24 NOTE — Progress Notes (Signed)
56 y.o. [id number] Married White or Caucasian Not Hispanic or Latino female here for annual exam.  She interested in discussing HRT. She is on low dose prempro, if she doesn't take it she gets moody and night sweats.   No vaginal bleeding. She has some entry dyspareunia, infrequent, not using a lubricant.   No bowel or bladder c/o.    H/O HSV, on suppression (filled by primary).   She has had multiple skin cancers removed (squamous cell). She is seen at [facility] regularly.   Patient's last menstrual period was 12/05/2013 (approximate).          Sexually active: Yes.    The current method of family planning is post menopausal status.    Exercising: Yes.     Walking  Smoker:  no  Health Maintenance: Pap:  08/13/20 WNL Hr HPV Neg, 07-29-17 neg  History of abnormal Pap:  yes HSIL in 2001 MMG:  06/24/21 Bi-rads 2 benign ( care everywhere)  BMD:    none  Colonoscopy: 07/30/14 possible serous adenoma polyp. F/u in 2021, small polyp. F/U in 5 years.  TDaP:  08/13/20  Gardasil: n/a   reports that she has never smoked. She has never used smokeless tobacco. She reports current alcohol use of about 4.0 standard drinks of alcohol per week. She reports that she does not use drugs. Works as an Glass blower/designer. 1 daughter and 50 year old grandson.   Past Medical History:  Diagnosis Date   Abnormal pap 09/01   CIN1, II HR HPV   Depression    Dyspareunia 03/04   PUS (neg)   Echocardiogram abnormal about 1998   physiological mitral & tricuspid regurge, but no MVP, No SBE prophylaxis   GERD (gastroesophageal reflux disease) 10/04   H/O tension headache 09/01   family stressors   HSV (herpes simplex virus) anogenital infection hx   Hypertension    Hypertension    IBS (irritable bowel syndrome)    IC (interstitial cystitis) 11/2002   Dr. Amalia Hailey   Rotator cuff injury    partial tear left arm   Squamous cell carcinoma of leg, left    clear margins   STD (sexually transmitted disease) 11/30/99, 12/06    HSV, 12/06   TMJ (temporomandibular joint syndrome) 2004    Past Surgical History:  Procedure Laterality Date   AUGMENTATION MAMMAPLASTY  01/08   reconstruction   COLPOSCOPY  10/01   no lesion   FOOT SURGERY  2012   right    KNEE ARTHROPLASTY  1998   left knee reconstruction   ROTATOR CUFF REPAIR  2019   1/19 left arm, 11/19 right arm   SQUAMOUS CELL CARCINOMA EXCISION     left leg 07/2018   VAGINA SURGERY  1982   rectal/vaginal repair post partum    Current Outpatient Medications  Medication Sig Dispense Refill   Cholecalciferol (VITAMIN D3) 1000000 UNIT/GM LIQD by Does not apply route.     clonazePAM (KLONOPIN) 0.5 MG tablet Take by mouth.     Cyanocobalamin (B-12 PO) Take by mouth.     EVENING PRIMROSE OIL PO Take by mouth. Take '1300mg'$  once daily     fluticasone (FLONASE) 50 MCG/ACT nasal spray SPRAY 2 SPRAYS INTO EACH NOSTRIL EVERY DAY  2   hyoscyamine (LEVBID) 0.375 MG 12 hr tablet Take by mouth.     latanoprost (XALATAN) 0.005 % ophthalmic solution Place 1 drop into both eyes at bedtime.      loratadine (CLARITIN) 10 MG tablet  Take 10 mg by mouth daily.     naproxen (NAPROSYN) 500 MG tablet Take by mouth.     Omega-3 Fatty Acids (FISH OIL) 1000 MG CAPS Take by mouth daily.     omeprazole (PRILOSEC) 20 MG capsule Take 40 mg by mouth daily.      PREMPRO 0.3-1.5 MG tablet TAKE 1 TABLET BY MOUTH EVERY DAY 84 tablet 0   valACYclovir (VALTREX) 500 MG tablet TAKE 1 TABLET BY MOUTH TWICE A DAY.  TAKE FOR 3 DAYS AS NEEDED FOR AN OUTBREAK. 180 tablet 1   No current facility-administered medications for this visit.    Family History  Problem Relation Age of Onset   Cervical cancer Mother    Scoliosis Mother    Diabetes Mother 3   Cervical cancer Maternal Grandmother    Lung cancer Maternal Grandfather     Review of Systems  All other systems reviewed and are negative.   Exam:   BP 124/76   Pulse 78   Ht '5\' 4"'$  (1.626 m)   Wt 147 lb (66.7 kg)   LMP 12/05/2013  (Approximate)   SpO2 98%   BMI 25.23 kg/m   Weight change: '@WEIGHTCHANGE'$ @ Height:   Height: '5\' 4"'$  (162.6 cm)  Ht Readings from Last 3 Encounters:  09/01/21 '5\' 4"'$  (1.626 m)  08/13/20 '5\' 5"'$  (1.651 m)  08/09/19 5' 4.5" (1.638 m)    General appearance: alert, cooperative and appears stated age Head: Normocephalic, without obvious abnormality, atraumatic Neck: no adenopathy, supple, symmetrical, trachea midline and thyroid normal to inspection and palpation Lungs: clear to auscultation bilaterally Cardiovascular: regular rate and rhythm Breasts: normal appearance, no masses or tenderness, bilateral implants are soft, no obvious leak.  Abdomen: soft, non-tender; non distended,  no masses,  no organomegaly Extremities: extremities normal, atraumatic, no cyanosis or edema Skin: Skin color, texture, turgor normal. No rashes or lesions Lymph nodes: Cervical, supraclavicular, and axillary nodes normal. No abnormal inguinal nodes palpated Neurologic: Grossly normal   Pelvic: External genitalia:  no lesions              Urethra:  normal appearing urethra with no masses, tenderness or lesions              Bartholins and Skenes: normal                 Vagina: well estrogenized appearing vagina with normal color and discharge, no lesions              Cervix: no lesions               Bimanual Exam:  Uterus:  normal size, contour, position, consistency, mobility, non-tender              Adnexa: no mass, fullness, tenderness               Rectovaginal: Confirms               Anus:  normal sphincter tone, no lesions  Gae Dry chaperoned for the exam.   1. Well woman exam Discussed breast self exam Discussed calcium and vit D intake No pap this year Mammogram and colonoscopy are UTD  2. Hormone replacement therapy (HRT) Discussed changing from prempro to transdermal estrogen and prometrium - estradiol (VIVELLE-DOT) 0.025 MG/24HR; Place 1 patch onto the skin 2 (two) times a week.   Dispense: 24 patch; Refill: 3 - progesterone (PROMETRIUM) 100 MG capsule; Take 1 capsule (100 mg total) by mouth daily.  Dispense:  90 capsule; Refill: 3  3. Herpes simplex infection of genitourinary system On valtrex from her primary  4. History of bilateral breast implants Recent mammogram with possible silicone leak. She will contact her plastic surgeon for further recommendations Will reach back out to me if she wants me to schedule a breast MRI for her (declines scheduling today)

## 2021-08-26 ENCOUNTER — Ambulatory Visit: Payer: BC Managed Care – PPO | Admitting: Obstetrics and Gynecology

## 2021-09-01 ENCOUNTER — Ambulatory Visit (INDEPENDENT_AMBULATORY_CARE_PROVIDER_SITE_OTHER): Payer: BC Managed Care – PPO | Admitting: Obstetrics and Gynecology

## 2021-09-01 ENCOUNTER — Encounter: Payer: Self-pay | Admitting: Obstetrics and Gynecology

## 2021-09-01 VITALS — BP 124/76 | HR 78 | Ht 64.0 in | Wt 147.0 lb

## 2021-09-01 DIAGNOSIS — Z79890 Hormone replacement therapy (postmenopausal): Secondary | ICD-10-CM | POA: Diagnosis not present

## 2021-09-01 DIAGNOSIS — Z9882 Breast implant status: Secondary | ICD-10-CM

## 2021-09-01 DIAGNOSIS — Z01419 Encounter for gynecological examination (general) (routine) without abnormal findings: Secondary | ICD-10-CM | POA: Diagnosis not present

## 2021-09-01 DIAGNOSIS — A6000 Herpesviral infection of urogenital system, unspecified: Secondary | ICD-10-CM

## 2021-09-01 MED ORDER — PROGESTERONE MICRONIZED 100 MG PO CAPS: 100.0000 mg | ORAL_CAPSULE | Freq: Every day | ORAL | 3 refills | Status: DC

## 2021-09-01 MED ORDER — ESTRADIOL 0.025 MG/24HR TD PTTW: 1.0000 | MEDICATED_PATCH | TRANSDERMAL | 3 refills | Status: DC

## 2021-11-13 ENCOUNTER — Other Ambulatory Visit: Payer: Self-pay | Admitting: Obstetrics and Gynecology

## 2021-11-13 DIAGNOSIS — Z79890 Hormone replacement therapy (postmenopausal): Secondary | ICD-10-CM

## 2021-11-13 NOTE — Telephone Encounter (Signed)
Last annual exam was 09/01/21  Last mammogram with [facility] was 06/24/21 (copy will be faxed to office)

## 2022-01-19 ENCOUNTER — Encounter: Payer: Self-pay | Admitting: Obstetrics and Gynecology

## 2022-01-19 ENCOUNTER — Ambulatory Visit: Payer: BC Managed Care – PPO | Admitting: Obstetrics and Gynecology

## 2022-01-19 VITALS — BP 128/81 | HR 60 | Ht 63.0 in | Wt 142.0 lb

## 2022-01-19 DIAGNOSIS — N760 Acute vaginitis: Secondary | ICD-10-CM | POA: Diagnosis not present

## 2022-01-19 DIAGNOSIS — Z87448 Personal history of other diseases of urinary system: Secondary | ICD-10-CM | POA: Diagnosis not present

## 2022-01-19 DIAGNOSIS — B9689 Other specified bacterial agents as the cause of diseases classified elsewhere: Secondary | ICD-10-CM | POA: Diagnosis not present

## 2022-01-19 LAB — URINALYSIS, COMPLETE
Bacteria, UA: NONE SEEN /HPF
Bilirubin Urine: NEGATIVE
Casts: NONE SEEN /LPF
Crystals: NONE SEEN /HPF
Glucose, UA: NEGATIVE
Hyaline Cast: NONE SEEN /LPF
Ketones, ur: NEGATIVE
Nitrite: NEGATIVE
Protein, ur: NEGATIVE
Specific Gravity, Urine: 1.015 (ref 1.001–1.035)
Yeast: NONE SEEN /HPF
pH: 6 (ref 5.0–8.0)

## 2022-01-19 LAB — WET PREP FOR TRICH, YEAST, CLUE

## 2022-01-19 MED ORDER — METRONIDAZOLE 0.75 % VA GEL: 1.0000 | Freq: Every day | VAGINAL | 0 refills | Status: DC

## 2022-01-19 MED ORDER — BETAMETHASONE VALERATE 0.1 % EX OINT: 1.0000 | TOPICAL_OINTMENT | Freq: Two times a day (BID) | CUTANEOUS | 0 refills | Status: DC

## 2022-01-19 NOTE — Progress Notes (Signed)
GYNECOLOGY  VISIT   HPI: 56 y.o.   Married White or Caucasian Not Hispanic or Latino  female   [phone] with Patient's last menstrual period was 12/05/2013 (approximate).   here for vaginal infection per pt and another issues. Pt has a 5 day h/o a thin/cloudy vaginal discharge and has had burning when sitting. Slight itching, no odor.   She had urinalysis done as a screening test around 12/30/21, + for microscopic blood.  No vaginal bleeding.   Per pt, 6 weeks ago, Pt was constipated and was straining and described something to fall out like her bladder. She said it went back in on it's own over the course of two hours. She had to wait 24 hours to use the bathroom.  GYNECOLOGIC HISTORY: Patient's last menstrual period was 12/05/2013 (approximate). Contraception: vasectomy Menopausal hormone therapy: estradiol and progesterone        OB History     Gravida  3   Para  1   Term  1   Preterm  0   AB  2   Living  1      SAB  0   IAB  0   Ectopic  0   Multiple  0   Live Births  1              Patient Active Problem List   Diagnosis Date Noted   Hypertension    Herpes simplex infection of genitourinary system 09/08/2018    Past Medical History:  Diagnosis Date   Abnormal pap 09/01   CIN1, II HR HPV   Depression    Dyspareunia 03/04   PUS (neg)   Echocardiogram abnormal about 1998   physiological mitral & tricuspid regurge, but no MVP, No SBE prophylaxis   GERD (gastroesophageal reflux disease) 10/04   H/O tension headache 09/01   family stressors   HSV (herpes simplex virus) anogenital infection hx   Hypertension    Hypertension    IBS (irritable bowel syndrome)    IC (interstitial cystitis) 11/2002   Dr. Amalia Hailey   Rotator cuff injury    partial tear left arm   Squamous cell carcinoma of leg, left    clear margins   STD (sexually transmitted disease) 11/30/99, 12/06   HSV, 12/06   TMJ (temporomandibular joint syndrome) 2004    Past Surgical  History:  Procedure Laterality Date   AUGMENTATION MAMMAPLASTY  01/08   reconstruction   COLPOSCOPY  10/01   no lesion   FOOT SURGERY  2012   right    KNEE ARTHROPLASTY  1998   left knee reconstruction   ROTATOR CUFF REPAIR  2019   1/19 left arm, 11/19 right arm   SQUAMOUS CELL CARCINOMA EXCISION     left leg 07/2018   VAGINA SURGERY  1982   rectal/vaginal repair post partum    Current Outpatient Medications  Medication Sig Dispense Refill   Cholecalciferol (VITAMIN D3) 1000000 UNIT/GM LIQD by Does not apply route.     clonazePAM (KLONOPIN) 0.5 MG tablet Take by mouth.     estradiol (VIVELLE-DOT) 0.025 MG/24HR Place 1 patch onto the skin 2 (two) times a week. 24 patch 3   EVENING PRIMROSE OIL PO Take by mouth. Take '1300mg'$  once daily     fluticasone (FLONASE) 50 MCG/ACT nasal spray SPRAY 2 SPRAYS INTO EACH NOSTRIL EVERY DAY  2   hyoscyamine (LEVBID) 0.375 MG 12 hr tablet Take by mouth.     latanoprost (XALATAN) 0.005 % ophthalmic  solution Place 1 drop into both eyes at bedtime.      loratadine (CLARITIN) 10 MG tablet Take 10 mg by mouth daily.     naproxen (NAPROSYN) 500 MG tablet Take by mouth.     Omega-3 Fatty Acids (FISH OIL) 1000 MG CAPS Take by mouth daily.     omeprazole (PRILOSEC) 20 MG capsule Take 40 mg by mouth daily.      progesterone (PROMETRIUM) 100 MG capsule Take 1 capsule (100 mg total) by mouth daily. 90 capsule 3   timolol (TIMOPTIC) 0.5 % ophthalmic solution Apply to eye.     triamcinolone cream (KENALOG) 0.1 % Apply topically 2 (two) times daily.     valACYclovir (VALTREX) 500 MG tablet TAKE 1 TABLET BY MOUTH TWICE A DAY.  TAKE FOR 3 DAYS AS NEEDED FOR AN OUTBREAK. 180 tablet 1   Cyanocobalamin (B-12 PO) Take by mouth. (Patient not taking: Reported on 01/19/2022)     No current facility-administered medications for this visit.     ALLERGIES: Patient has no known allergies.  Family History  Problem Relation Age of Onset   Cervical cancer Mother     Scoliosis Mother    Diabetes Mother 15   Cervical cancer Maternal Grandmother    Lung cancer Maternal Grandfather     Social History   Socioeconomic History   Marital status: Married    Spouse name: Not on file   Number of children: Not on file   Years of education: Not on file   Highest education level: Not on file  Occupational History   Not on file  Tobacco Use   Smoking status: Never   Smokeless tobacco: Never  Vaping Use   Vaping Use: Never used  Substance and Sexual Activity   Alcohol use: Yes    Alcohol/week: 4.0 standard drinks of alcohol    Types: 4 Standard drinks or equivalent per week   Drug use: No   Sexual activity: Yes    Partners: Male    Comment: husband vasectomy  Other Topics Concern   Not on file  Social History Narrative   Not on file   Social Determinants of Health   Financial Resource Strain: Not on file  Food Insecurity: Not on file  Transportation Needs: Not on file  Physical Activity: Not on file  Stress: Not on file  Social Connections: Not on file  Intimate Partner Violence: Not on file    Review of Systems  Genitourinary:  Positive for hematuria.       Vaginal discomfort when sitting and some discharge    PHYSICAL EXAMINATION:    BP 128/81 (BP Location: Right Arm, Patient Position: Sitting, Cuff Size: Normal)   Pulse 60   Ht '5\' 3"'$  (1.6 m)   Wt 142 lb (64.4 kg)   LMP 12/05/2013 (Approximate)   SpO2 99%   BMI 25.15 kg/m     General appearance: alert, cooperative and appears stated age   Pelvic: External genitalia:  no lesions, mild atrophy, mild erythema              Urethra:  normal appearing urethra with no masses, tenderness or lesions              Bartholins and Skenes: normal                 Vagina: atrophic appearing vagina with a slight increase in watery, white vaginal discharge  Cervix: no lesions              Chaperone was present for exam.  1. Acute vaginitis - WET PREP FOR TRICH, YEAST, CLUE:  +BV, +++WBC - SureSwab Advanced Candida Vaginitis (CV), TMA - betamethasone valerate ointment (VALISONE) 0.1 %; Apply 1 Application topically 2 (two) times daily.  Dispense: 30 g; Refill: 0  2. Bacterial vaginitis - metroNIDAZOLE (METROGEL) 0.75 % vaginal gel; Place 1 Applicatorful vaginally at bedtime.  Dispense: 70 g; Refill: 0  3. History of hematuria - Urinalysis, Complete: 0-2 RBC per HPF No f/u needed

## 2022-01-19 NOTE — Patient Instructions (Signed)

## 2022-01-20 LAB — SURESWAB® ADVANCED CANDIDA VAGINITIS (CV), TMA
CANDIDA SPECIES: DETECTED — AB
Candida glabrata: NOT DETECTED

## 2022-01-22 ENCOUNTER — Other Ambulatory Visit: Payer: Self-pay | Admitting: *Deleted

## 2022-01-22 MED ORDER — FLUCONAZOLE 150 MG PO TABS: ORAL_TABLET | ORAL | 0 refills | Status: DC

## 2022-02-04 ENCOUNTER — Ambulatory Visit: Payer: BC Managed Care – PPO | Admitting: Obstetrics and Gynecology

## 2022-02-04 ENCOUNTER — Telehealth: Payer: Self-pay

## 2022-02-04 ENCOUNTER — Encounter: Payer: Self-pay | Admitting: Obstetrics and Gynecology

## 2022-02-04 VITALS — BP 120/72 | HR 66 | Wt 140.0 lb

## 2022-02-04 DIAGNOSIS — N760 Acute vaginitis: Secondary | ICD-10-CM

## 2022-02-04 DIAGNOSIS — B009 Herpesviral infection, unspecified: Secondary | ICD-10-CM

## 2022-02-04 DIAGNOSIS — Z113 Encounter for screening for infections with a predominantly sexual mode of transmission: Secondary | ICD-10-CM

## 2022-02-04 DIAGNOSIS — N898 Other specified noninflammatory disorders of vagina: Secondary | ICD-10-CM | POA: Diagnosis not present

## 2022-02-04 LAB — WET PREP FOR TRICH, YEAST, CLUE

## 2022-02-04 NOTE — Progress Notes (Signed)
GYNECOLOGY  VISIT   HPI: 56 y.o.   Married White or Caucasian Not Hispanic or Latino  female   [phone] with Patient's last menstrual period was 12/05/2013 (approximate).   here for vaginal irritation and discharge. She was seen 2 weeks ago with vaginitis and treated with metrogel for BV and diflucan for yeast.   She was also have vaginal prolapse c/o.  She states her vaginal d/c has gotten worse. She c/o vaginal warmth, no itching, burning, no odor.  She had 2 other diflucan pills, so in the last 2 weeks she has taken 4 diflucan.   She has been constipated x 2-3 days. Feels like she is having a rectocele.   She went off prempro ~ 11/15/21. She switched to the patch and oral progesterone.  She has had a genital HSV outbreak and an eye infection with HSV after switching her hormones from prempro to the estradiol patch and prometrium. She is wondering if it is connected.  Because of her ocular HSV her primary checked her for HIV and hep C. Just be safe she would like cervical testing for STD's.    GYNECOLOGIC HISTORY: Patient's last menstrual period was 12/05/2013 (approximate). Contraception:pmp Menopausal hormone therapy: estradiol patch and progesterone         OB History     Gravida  3   Para  1   Term  1   Preterm  0   AB  2   Living  1      SAB  0   IAB  0   Ectopic  0   Multiple  0   Live Births  1              Patient Active Problem List   Diagnosis Date Noted   Hypertension    Herpes simplex infection of genitourinary system 09/08/2018    Past Medical History:  Diagnosis Date   Abnormal pap 09/01   CIN1, II HR HPV   Depression    Dyspareunia 03/04   PUS (neg)   Echocardiogram abnormal about 1998   physiological mitral & tricuspid regurge, but no MVP, No SBE prophylaxis   GERD (gastroesophageal reflux disease) 10/04   H/O tension headache 09/01   family stressors   HSV (herpes simplex virus) anogenital infection hx   Hypertension     Hypertension    IBS (irritable bowel syndrome)    IC (interstitial cystitis) 11/2002   Dr. Amalia Hailey   Rotator cuff injury    partial tear left arm   Squamous cell carcinoma of leg, left    clear margins   STD (sexually transmitted disease) 11/30/99, 12/06   HSV, 12/06   TMJ (temporomandibular joint syndrome) 2004    Past Surgical History:  Procedure Laterality Date   AUGMENTATION MAMMAPLASTY  01/08   reconstruction   COLPOSCOPY  10/01   no lesion   FOOT SURGERY  2012   right    KNEE ARTHROPLASTY  1998   left knee reconstruction   ROTATOR CUFF REPAIR  2019   1/19 left arm, 11/19 right arm   SQUAMOUS CELL CARCINOMA EXCISION     left leg 07/2018   VAGINA SURGERY  1982   rectal/vaginal repair post partum    Current Outpatient Medications  Medication Sig Dispense Refill   betamethasone valerate ointment (VALISONE) 0.1 % Apply 1 Application topically 2 (two) times daily. 30 g 0   Cholecalciferol (VITAMIN D3) 1000000 UNIT/GM LIQD by Does not apply route.  clonazePAM (KLONOPIN) 0.5 MG tablet Take by mouth.     Cyanocobalamin (B-12 PO) Take by mouth.     estradiol (VIVELLE-DOT) 0.025 MG/24HR Place 1 patch onto the skin 2 (two) times a week. 24 patch 3   EVENING PRIMROSE OIL PO Take by mouth. Take '1300mg'$  once daily     fluticasone (FLONASE) 50 MCG/ACT nasal spray SPRAY 2 SPRAYS INTO EACH NOSTRIL EVERY DAY  2   hyoscyamine (LEVBID) 0.375 MG 12 hr tablet Take by mouth.     latanoprost (XALATAN) 0.005 % ophthalmic solution Place 1 drop into both eyes at bedtime.      loratadine (CLARITIN) 10 MG tablet Take 10 mg by mouth daily.     naproxen (NAPROSYN) 500 MG tablet Take by mouth.     Omega-3 Fatty Acids (FISH OIL) 1000 MG CAPS Take by mouth daily.     omeprazole (PRILOSEC) 20 MG capsule Take 40 mg by mouth daily.      progesterone (PROMETRIUM) 100 MG capsule Take 1 capsule (100 mg total) by mouth daily. 90 capsule 3   timolol (TIMOPTIC) 0.5 % ophthalmic solution Apply to eye.      triamcinolone cream (KENALOG) 0.1 % Apply topically 2 (two) times daily.     valACYclovir (VALTREX) 500 MG tablet TAKE 1 TABLET BY MOUTH TWICE A DAY.  TAKE FOR 3 DAYS AS NEEDED FOR AN OUTBREAK. 180 tablet 1   No current facility-administered medications for this visit.     ALLERGIES: Patient has no known allergies.  Family History  Problem Relation Age of Onset   Cervical cancer Mother    Scoliosis Mother    Diabetes Mother 80   Cervical cancer Maternal Grandmother    Lung cancer Maternal Grandfather     Social History   Socioeconomic History   Marital status: Married    Spouse name: Not on file   Number of children: Not on file   Years of education: Not on file   Highest education level: Not on file  Occupational History   Not on file  Tobacco Use   Smoking status: Never   Smokeless tobacco: Never  Vaping Use   Vaping Use: Never used  Substance and Sexual Activity   Alcohol use: Yes    Alcohol/week: 4.0 standard drinks of alcohol    Types: 4 Standard drinks or equivalent per week   Drug use: No   Sexual activity: Yes    Partners: Male    Comment: husband vasectomy  Other Topics Concern   Not on file  Social History Narrative   Not on file   Social Determinants of Health   Financial Resource Strain: Not on file  Food Insecurity: Not on file  Transportation Needs: Not on file  Physical Activity: Not on file  Stress: Not on file  Social Connections: Not on file  Intimate Partner Violence: Not on file    Review of Systems  All other systems reviewed and are negative.   PHYSICAL EXAMINATION:    BP 120/72   Pulse 66   Wt 140 lb (63.5 kg)   LMP 12/05/2013 (Approximate)   SpO2 100%   BMI 24.80 kg/m     General appearance: alert, cooperative and appears stated age  Pelvic: External genitalia:  no lesions, no erythema              Urethra:  normal appearing urethra with no masses, tenderness or lesions  Bartholins and Skenes: normal                  Vagina: normal appearing vagina with a minimal amount of thick, white vaginal d/c. No prolapse noted with or without valsalva.               Cervix: no cervical motion tenderness and no lesions              Bimanual Exam:  Uterus:  normal size, contour, position, consistency, mobility, non-tender              Adnexa: no mass, fullness, tenderness                Chaperone was present for exam.  1. Vaginal discharge Minimal d/c on exam  - WET PREP FOR TRICH, YEAST, CLUE: negative - SureSwab Advanced Vaginitis Plus,TMA  2. Screening examination for STD (sexually transmitted disease) - SureSwab Advanced Vaginitis Plus,TMA   No rectocele noted on exam today. We discussed that prolapse can be present or not present depending on activity level. Reviewed avoiding straining and heavy lifting to help prevent worsening prolapse.

## 2022-02-04 NOTE — Telephone Encounter (Signed)
Pt was seen on 01/19/2022 for vaginitis sxs and was treated for BV and then a few days later for yeast as well. Pt reports completing both treatments. She's calling to reports the sxs never really seemed to go completely away. Reports having additional diflucan on hand from receiving them with abxs in the past and have all together used about 4 rounds since being seen and is stil having a thick white discharge and some external irritation with vulva feeling a bit warmer to touch than usual.  Pt reports currently dealing with a URI but has not taken any abxs for that, is just letting it run its course.  She also reports picking up a pearl suppository but didn't want to use until spoke with [place].  She is questioning if the change in her HRT back in 11/2021 could be the reason she is dealing with these vulvar/vaginal changes.  Please advise or would you prefer me just offer her an office visit? (Possibly your [phone] or [phone] slot?)

## 2022-02-04 NOTE — Telephone Encounter (Signed)
Please add her at 2:15

## 2022-02-04 NOTE — Telephone Encounter (Signed)
Pt agreed and voiced understanding. Pt added to schedule.

## 2022-02-05 LAB — SURESWAB® ADVANCED VAGINITIS PLUS,TMA
C. trachomatis RNA, TMA: NOT DETECTED
CANDIDA SPECIES: NOT DETECTED
Candida glabrata: NOT DETECTED
N. gonorrhoeae RNA, TMA: NOT DETECTED
SURESWAB(R) ADV BACTERIAL VAGINOSIS(BV),TMA: NEGATIVE
TRICHOMONAS VAGINALIS (TV),TMA: NOT DETECTED

## 2022-03-11 ENCOUNTER — Ambulatory Visit: Payer: BC Managed Care – PPO | Admitting: Obstetrics and Gynecology

## 2022-03-11 ENCOUNTER — Encounter: Payer: Self-pay | Admitting: Obstetrics and Gynecology

## 2022-03-11 VITALS — BP 128/78 | HR 66 | Temp 98.2°F | Wt 138.0 lb

## 2022-03-11 DIAGNOSIS — N949 Unspecified condition associated with female genital organs and menstrual cycle: Secondary | ICD-10-CM

## 2022-03-11 DIAGNOSIS — N3090 Cystitis, unspecified without hematuria: Secondary | ICD-10-CM

## 2022-03-11 DIAGNOSIS — N9489 Other specified conditions associated with female genital organs and menstrual cycle: Secondary | ICD-10-CM

## 2022-03-11 LAB — URINALYSIS, COMPLETE
Casts: NONE SEEN /LPF
Crystals: NONE SEEN /HPF
Glucose, UA: NEGATIVE
Hgb urine dipstick: NEGATIVE
Hyaline Cast: NONE SEEN /LPF
Leukocytes,Ua: NEGATIVE
Nitrite: NEGATIVE
Specific Gravity, Urine: 1.02 (ref 1.001–1.035)
Yeast: NONE SEEN /HPF
pH: 6 (ref 5.0–8.0)

## 2022-03-11 LAB — WET PREP FOR TRICH, YEAST, CLUE

## 2022-03-11 MED ORDER — PHENAZOPYRIDINE HCL 200 MG PO TABS: 200.0000 mg | ORAL_TABLET | Freq: Three times a day (TID) | ORAL | 0 refills | Status: DC

## 2022-03-11 MED ORDER — SULFAMETHOXAZOLE-TRIMETHOPRIM 800-160 MG PO TABS: 1.0000 | ORAL_TABLET | Freq: Two times a day (BID) | ORAL | 0 refills | Status: DC

## 2022-03-11 NOTE — Patient Instructions (Signed)
Urinary Tract Infection, Adult  A urinary tract infection (UTI) is an infection of any part of the urinary tract. The urinary tract includes the kidneys, ureters, bladder, and urethra. These organs make, store, and get rid of urine in the body. An upper UTI affects the ureters and kidneys. A lower UTI affects the bladder and urethra. What are the causes? Most urinary tract infections are caused by bacteria in your genital area around your urethra, where urine leaves your body. These bacteria grow and cause inflammation of your urinary tract. What increases the risk? You are more likely to develop this condition if: You have a urinary catheter that stays in place. You are not able to control when you urinate or have a bowel movement (incontinence). You are female and you: Use a spermicide or diaphragm for birth control. Have low estrogen levels. Are pregnant. You have certain genes that increase your risk. You are sexually active. You take antibiotic medicines. You have a condition that causes your flow of urine to slow down, such as: An enlarged prostate, if you are female. Blockage in your urethra. A kidney stone. A nerve condition that affects your bladder control (neurogenic bladder). Not getting enough to drink, or not urinating often. You have certain medical conditions, such as: Diabetes. A weak disease-fighting system (immunesystem). Sickle cell disease. Gout. Spinal cord injury. What are the signs or symptoms? Symptoms of this condition include: Needing to urinate right away (urgency). Frequent urination. This may include small amounts of urine each time you urinate. Pain or burning with urination. Blood in the urine. Urine that smells bad or unusual. Trouble urinating. Cloudy urine. Vaginal discharge, if you are female. Pain in the abdomen or the lower back. You may also have: Vomiting or a decreased appetite. Confusion. Irritability or tiredness. A fever or  chills. Diarrhea. The first symptom in older adults may be confusion. In some cases, they may not have any symptoms until the infection has worsened. How is this diagnosed? This condition is diagnosed based on your medical history and a physical exam. You may also have other tests, including: Urine tests. Blood tests. Tests for STIs (sexually transmitted infections). If you have had more than one UTI, a cystoscopy or imaging studies may be done to determine the cause of the infections. How is this treated? Treatment for this condition includes: Antibiotic medicine. Over-the-counter medicines to treat discomfort. Drinking enough water to stay hydrated. If you have frequent infections or have other conditions such as a kidney stone, you may need to see a health care provider who specializes in the urinary tract (urologist). In rare cases, urinary tract infections can cause sepsis. Sepsis is a life-threatening condition that occurs when the body responds to an infection. Sepsis is treated in the hospital with IV antibiotics, fluids, and other medicines. Follow these instructions at home:  Medicines Take over-the-counter and prescription medicines only as told by your health care provider. If you were prescribed an antibiotic medicine, take it as told by your health care provider. Do not stop using the antibiotic even if you start to feel better. General instructions Make sure you: Empty your bladder often and completely. Do not hold urine for long periods of time. Empty your bladder after sex. Wipe from front to back after urinating or having a bowel movement if you are female. Use each tissue only one time when you wipe. Drink enough fluid to keep your urine pale yellow. Keep all follow-up visits. This is important. Contact a health   care provider if: Your symptoms do not get better after 1-2 days. Your symptoms go away and then return. Get help right away if: You have severe pain in  your back or your lower abdomen. You have a fever or chills. You have nausea or vomiting. Summary A urinary tract infection (UTI) is an infection of any part of the urinary tract, which includes the kidneys, ureters, bladder, and urethra. Most urinary tract infections are caused by bacteria in your genital area. Treatment for this condition often includes antibiotic medicines. If you were prescribed an antibiotic medicine, take it as told by your health care provider. Do not stop using the antibiotic even if you start to feel better. Keep all follow-up visits. This is important. This information is not intended to replace advice given to you by your health care provider. Make sure you discuss any questions you have with your health care provider. Document Revised: 09/14/2019 Document Reviewed: 09/14/2019 Elsevier Patient Education  2023 Elsevier Inc.  

## 2022-03-11 NOTE — Progress Notes (Signed)
GYNECOLOGY  VISIT   HPI: 57 y.o.   Married White or Caucasian Not Hispanic or Latino  female   [phone] with Patient's last menstrual period was 12/05/2013 (approximate).   here for she is having urinary pressure, voiding small amounts, frequency, urgency, dysuria. She feels like she has to void and someone is sitting on her bladder. Symptoms started a week ago after having IC, symptoms improved a little then got worse again. No fevers, no flank pain.  Currently on antibiotics for URI.    She is also having vaginal burning. She used monistat 1 dose yesterday. No itching.   GYNECOLOGIC HISTORY: Patient's last menstrual period was 12/05/2013 (approximate). Contraception:pmp  Menopausal hormone therapy: yes estradiol patch and progesterone.         OB History     Gravida  3   Para  1   Term  1   Preterm  0   AB  2   Living  1      SAB  0   IAB  0   Ectopic  0   Multiple  0   Live Births  1              Patient Active Problem List   Diagnosis Date Noted   Hypertension    Herpes simplex infection of genitourinary system 09/08/2018    Past Medical History:  Diagnosis Date   Abnormal pap 09/01   CIN1, II HR HPV   Depression    Dyspareunia 03/04   PUS (neg)   Echocardiogram abnormal about 1998   physiological mitral & tricuspid regurge, but no MVP, No SBE prophylaxis   GERD (gastroesophageal reflux disease) 10/04   H/O tension headache 09/01   family stressors   HSV (herpes simplex virus) anogenital infection hx   Hypertension    Hypertension    IBS (irritable bowel syndrome)    IC (interstitial cystitis) 11/2002   Dr. Amalia Hailey   Rotator cuff injury    partial tear left arm   Squamous cell carcinoma of leg, left    clear margins   STD (sexually transmitted disease) 11/30/99, 12/06   HSV, 12/06   TMJ (temporomandibular joint syndrome) 2004    Past Surgical History:  Procedure Laterality Date   AUGMENTATION MAMMAPLASTY  01/08   reconstruction    COLPOSCOPY  10/01   no lesion   FOOT SURGERY  2012   right    KNEE ARTHROPLASTY  1998   left knee reconstruction   ROTATOR CUFF REPAIR  2019   1/19 left arm, 11/19 right arm   SQUAMOUS CELL CARCINOMA EXCISION     left leg 07/2018   VAGINA SURGERY  1982   rectal/vaginal repair post partum    Current Outpatient Medications  Medication Sig Dispense Refill   amoxicillin-clavulanate (AUGMENTIN) 875-125 MG tablet Take 1 tablet by mouth 2 (two) times daily.     betamethasone valerate ointment (VALISONE) 0.1 % Apply 1 Application topically 2 (two) times daily. 30 g 0   Cholecalciferol (VITAMIN D3) 1000000 UNIT/GM LIQD by Does not apply route.     clonazePAM (KLONOPIN) 0.5 MG tablet Take by mouth.     estradiol (VIVELLE-DOT) 0.025 MG/24HR Place 1 patch onto the skin 2 (two) times a week. 24 patch 3   EVENING PRIMROSE OIL PO Take by mouth. Take '1300mg'$  once daily     fluticasone (FLONASE) 50 MCG/ACT nasal spray SPRAY 2 SPRAYS INTO EACH NOSTRIL EVERY DAY  2   hyoscyamine (LEVBID) 0.375  MG 12 hr tablet Take by mouth.     latanoprost (XALATAN) 0.005 % ophthalmic solution Place 1 drop into both eyes at bedtime.      loratadine (CLARITIN) 10 MG tablet Take 10 mg by mouth daily.     naproxen (NAPROSYN) 500 MG tablet Take by mouth.     Omega-3 Fatty Acids (FISH OIL) 1000 MG CAPS Take by mouth daily.     omeprazole (PRILOSEC) 20 MG capsule Take 40 mg by mouth daily.      progesterone (PROMETRIUM) 100 MG capsule Take 1 capsule (100 mg total) by mouth daily. 90 capsule 3   timolol (TIMOPTIC) 0.5 % ophthalmic solution Apply to eye.     triamcinolone cream (KENALOG) 0.1 % Apply topically 2 (two) times daily.     valACYclovir (VALTREX) 500 MG tablet TAKE 1 TABLET BY MOUTH TWICE A DAY.  TAKE FOR 3 DAYS AS NEEDED FOR AN OUTBREAK. 180 tablet 1   Cyanocobalamin (B-12 PO) Take by mouth.     No current facility-administered medications for this visit.     ALLERGIES: Patient has no known  allergies.  Family History  Problem Relation Age of Onset   Cervical cancer Mother    Scoliosis Mother    Diabetes Mother 24   Cervical cancer Maternal Grandmother    Lung cancer Maternal Grandfather     Social History   Socioeconomic History   Marital status: Married    Spouse name: Not on file   Number of children: Not on file   Years of education: Not on file   Highest education level: Not on file  Occupational History   Not on file  Tobacco Use   Smoking status: Never   Smokeless tobacco: Never  Vaping Use   Vaping Use: Never used  Substance and Sexual Activity   Alcohol use: Yes    Alcohol/week: 4.0 standard drinks of alcohol    Types: 4 Standard drinks or equivalent per week   Drug use: No   Sexual activity: Yes    Partners: Male    Comment: husband vasectomy  Other Topics Concern   Not on file  Social History Narrative   Not on file   Social Determinants of Health   Financial Resource Strain: Not on file  Food Insecurity: Not on file  Transportation Needs: Not on file  Physical Activity: Not on file  Stress: Not on file  Social Connections: Not on file  Intimate Partner Violence: Not on file    ROS  PHYSICAL EXAMINATION:    BP 128/78   Pulse 66   Temp 98.2 F (36.8 C)   Wt 138 lb (62.6 kg)   LMP 12/05/2013 (Approximate)   SpO2 100%   BMI 24.45 kg/m     General appearance: alert, cooperative and appears stated age CVA: not tender Abdomen: soft, tender in the SP region, no rebound, no guarding; non distended, no masses,  no organomegaly  Pelvic: External genitalia:  no lesions              Urethra:  normal appearing urethra with no masses, tenderness or lesions              Bartholins and Skenes: normal                 Vagina: mildly erythematous appearing vagina with some thick, white vaginal d/c (used monistat yesterday)              Cervix: no cervical motion tenderness and  no lesions              Bimanual Exam:  Uterus:  normal size,  contour, position, consistency, mobility, non-tender and anteverted              Adnexa: no mass, fullness, tenderness              Bladder: very tender to palpation  Chaperone was present for exam.  1. Cystitis Symptoms and exam c/w cystitis - Urinalysis, Complete - Urine Culture - sulfamethoxazole-trimethoprim (BACTRIM DS) 800-160 MG tablet; Take 1 tablet by mouth 2 (two) times daily. One PO BID x 3 days  Dispense: 6 tablet; Refill: 0 - phenazopyridine (PYRIDIUM) 200 MG tablet; Take 1 tablet (200 mg total) by mouth 3 (three) times daily with meals.  Dispense: 6 tablet; Refill: 0  2. Vaginal burning - WET PREP FOR TRICH, YEAST, CLUE: negative - SureSwab Advanced Vaginitis, TMA

## 2022-03-12 ENCOUNTER — Encounter: Payer: Self-pay | Admitting: Obstetrics and Gynecology

## 2022-03-12 LAB — URINE CULTURE
MICRO NUMBER:: 14472960
SPECIMEN QUALITY:: ADEQUATE

## 2022-03-12 LAB — SURESWAB® ADVANCED VAGINITIS,TMA
CANDIDA SPECIES: NOT DETECTED
Candida glabrata: NOT DETECTED
SURESWAB(R) ADV BACTERIAL VAGINOSIS(BV),TMA: POSITIVE — AB
TRICHOMONAS VAGINALIS (TV),TMA: NOT DETECTED

## 2022-03-16 ENCOUNTER — Other Ambulatory Visit: Payer: Self-pay | Admitting: *Deleted

## 2022-03-16 MED ORDER — METRONIDAZOLE 500 MG PO TABS: 500.0000 mg | ORAL_TABLET | Freq: Two times a day (BID) | ORAL | 0 refills | Status: DC

## 2022-04-30 ENCOUNTER — Telehealth: Payer: Self-pay

## 2022-04-30 NOTE — Telephone Encounter (Signed)
Pt calling to report recent conversation that you guys had regarding her hormones and possibility of switching back from estrogen (patch) and oral progesterone to her combined prempro.  Pt stating she would like to switch back now due to hot flashes, night sweats ramping up again and is noticing some abnormal facial hair growth. Please advise.  **FYI. She wanted me to let you know that she saw a uro w/ [facility] about her urinary sxs and found out that she had a kidney stone that ended up getting stuck and she needed to have lithotripsy due causing a recent blockage. Notes in Care Everywhere.

## 2022-05-06 MED ORDER — PREMPRO 0.3-1.5 MG PO TABS: 1.0000 | ORAL_TABLET | Freq: Every day | ORAL | 1 refills | Status: DC

## 2022-05-06 NOTE — Telephone Encounter (Signed)
Please let the patient know that the prempro script has been sent to her pharmacy

## 2022-05-11 ENCOUNTER — Ambulatory Visit: Payer: BC Managed Care – PPO | Admitting: Radiology

## 2022-05-11 VITALS — BP 142/94

## 2022-05-11 DIAGNOSIS — N760 Acute vaginitis: Secondary | ICD-10-CM

## 2022-05-11 DIAGNOSIS — N951 Menopausal and female climacteric states: Secondary | ICD-10-CM

## 2022-05-11 DIAGNOSIS — N958 Other specified menopausal and perimenopausal disorders: Secondary | ICD-10-CM

## 2022-05-11 LAB — WET PREP FOR TRICH, YEAST, CLUE

## 2022-05-11 MED ORDER — NUVESSA 1.3 % VA GEL: 5.0000 g | Freq: Once | VAGINAL | 0 refills | Status: AC

## 2022-05-11 MED ORDER — IMVEXXY MAINTENANCE PACK 10 MCG VA INST: 1.0000 | VAGINAL_INSERT | VAGINAL | 4 refills | Status: DC

## 2022-05-11 NOTE — Progress Notes (Signed)
      Subjective: Gabriela Figueroa is a 57 y.o. female who complains of recurrent BV, used monistat last week after she noticed a fishy odor. Last dose was last night.   Review of Systems  All other systems reviewed and are negative.   Past Medical History:  Diagnosis Date   Abnormal pap 09/01   CIN1, II HR HPV   Depression    Dyspareunia 03/04   PUS (neg)   Echocardiogram abnormal about 1998   physiological mitral & tricuspid regurge, but no MVP, No SBE prophylaxis   GERD (gastroesophageal reflux disease) 10/04   H/O tension headache 09/01   family stressors   HSV (herpes simplex virus) anogenital infection hx   Hypertension    Hypertension    IBS (irritable bowel syndrome)    IC (interstitial cystitis) 11/2002   Dr. Amalia Hailey   Rotator cuff injury    partial tear left arm   Squamous cell carcinoma of leg, left    clear margins   STD (sexually transmitted disease) 11/30/99, 12/06   HSV, 12/06   TMJ (temporomandibular joint syndrome) 2004      Objective:  Today's Vitals   05/11/22 0953 05/11/22 0958  BP: (!) 160/100 (!) 142/94   There is no height or weight on file to calculate BMI.   -General: no acute distress -Vulva: without lesions or discharge -Vagina: moderately atrophic discharge present, wet prep obtained -Cervix: no lesion or discharge, no CMT -Perineum: no lesions -Uterus: Mobile, non tender -Adnexa: no masses or tenderness   Microscopic wet-mount exam shows clue cells.   Chaperone offered and declined.  Assessment:/Plan:  1. Acute vaginitis - WET PREP FOR TRICH, YEAST, CLUE  2. Genitourinary syndrome of menopause - Estradiol (IMVEXXY MAINTENANCE PACK) 10 MCG INST; Place 1 capsule vaginally 2 (two) times a week.  Dispense: 8 each; Refill: 4  3. BV (bacterial vaginosis) - metroNIDAZOLE (NUVESSA) 1.3 % GEL; Place 5 g vaginally once for 1 dose.  Dispense: 5 g; Refill: 0   Avoid intercourse until symptoms are resolved. Safe sex encouraged. Avoid  the use of soaps or perfumed products in the peri area. Avoid tub baths and sitting in sweaty or wet clothing for prolonged periods of time.

## 2022-06-08 NOTE — Progress Notes (Unsigned)
GYNECOLOGY  VISIT   HPI: 57 y.o.   Married  Caucasian  female   [phone] with Patient's last menstrual period was 12/05/2013 (approximate).   here for   suspected BV. Pt noticed symptoms, 4/10 (discharge and odor). Pt just had treatment 05/11/22 for BV. She used Metrogel. Usually needs Diflucan for yeast infections following tx of BV.   05/11/22 - BV.  Treated with 1 dose of Nuvessa.  03/11/22 - BV 02/04/22 - negative for BV and for yeast 01/19/22 - BV  Dealing with renal stones.  Had lithotripsy through [facility] in [place].  She is taking medication to help empty her bladder.   Hx IBS also.   Using Prempro and Imvexxy.   GYNECOLOGIC HISTORY: Patient's last menstrual period was 12/05/2013 (approximate). Contraception:  PMP/ vasectomy Menopausal hormone therapy:  vivelle-dot Last mammogram:  2023 per pt, WNL Last pap smear:   08/13/20 neg: HR HPV neg, 07/29/17 neg: HR HPV neg        OB History     Gravida  3   Para  1   Term  1   Preterm  0   AB  2   Living  1      SAB  0   IAB  0   Ectopic  0   Multiple  0   Live Births  1              Patient Active Problem List   Diagnosis Date Noted   Hypertension    Herpes simplex infection of genitourinary system 09/08/2018    Past Medical History:  Diagnosis Date   Abnormal pap 09/01   CIN1, II HR HPV   Depression    Dyspareunia 03/04   PUS (neg)   Echocardiogram abnormal about 1998   physiological mitral & tricuspid regurge, but no MVP, No SBE prophylaxis   GERD (gastroesophageal reflux disease) 10/04   H/O tension headache 09/01   family stressors   HSV (herpes simplex virus) anogenital infection hx   Hypertension    Hypertension    IBS (irritable bowel syndrome)    IC (interstitial cystitis) 11/2002   Dr. Logan Bores   Rotator cuff injury    partial tear left arm   Squamous cell carcinoma of leg, left    clear margins   STD (sexually transmitted disease) 11/30/99, 12/06   HSV, 12/06   TMJ  (temporomandibular joint syndrome) 2004    Past Surgical History:  Procedure Laterality Date   AUGMENTATION MAMMAPLASTY  01/08   reconstruction   COLPOSCOPY  10/01   no lesion   FOOT SURGERY  2012   right    KNEE ARTHROPLASTY  1998   left knee reconstruction   ROTATOR CUFF REPAIR  2019   1/19 left arm, 11/19 right arm   SQUAMOUS CELL CARCINOMA EXCISION     left leg 07/2018   VAGINA SURGERY  1982   rectal/vaginal repair post partum    Current Outpatient Medications  Medication Sig Dispense Refill   betamethasone valerate ointment (VALISONE) 0.1 % Apply 1 Application topically 2 (two) times daily. 30 g 0   Cholecalciferol (VITAMIN D3) 1000000 UNIT/GM LIQD by Does not apply route.     clonazePAM (KLONOPIN) 0.5 MG tablet Take by mouth.     cyclobenzaprine (FLEXERIL) 5 MG tablet Take 5 mg by mouth 3 (three) times daily as needed.     Estradiol (IMVEXXY MAINTENANCE PACK) 10 MCG INST Place 1 capsule vaginally 2 (two) times a week.  8 each 4   estrogen, conjugated,-medroxyprogesterone (PREMPRO) 0.3-1.5 MG tablet Take 1 tablet by mouth daily. 90 tablet 1   EVENING PRIMROSE OIL PO Take by mouth. Take 1300mg  once daily     fluticasone (FLONASE) 50 MCG/ACT nasal spray SPRAY 2 SPRAYS INTO EACH NOSTRIL EVERY DAY  2   hyoscyamine (LEVBID) 0.375 MG 12 hr tablet Take by mouth.     latanoprost (XALATAN) 0.005 % ophthalmic solution Place 1 drop into both eyes at bedtime.      loratadine (CLARITIN) 10 MG tablet Take 10 mg by mouth daily.     naproxen (NAPROSYN) 500 MG tablet Take by mouth.     Omega-3 Fatty Acids (FISH OIL) 1000 MG CAPS Take by mouth daily.     omeprazole (PRILOSEC) 20 MG capsule Take 40 mg by mouth daily.      timolol (TIMOPTIC) 0.5 % ophthalmic solution Apply to eye.     triamcinolone cream (KENALOG) 0.1 % Apply topically 2 (two) times daily.     valACYclovir (VALTREX) 500 MG tablet TAKE 1 TABLET BY MOUTH TWICE A DAY.  TAKE FOR 3 DAYS AS NEEDED FOR AN OUTBREAK. 180 tablet 1   No  current facility-administered medications for this visit.     ALLERGIES: Patient has no known allergies.  Family History  Problem Relation Age of Onset   Cervical cancer Mother    Scoliosis Mother    Diabetes Mother 26   Cervical cancer Maternal Grandmother    Lung cancer Maternal Grandfather     Social History   Socioeconomic History   Marital status: Married    Spouse name: Not on file   Number of children: Not on file   Years of education: Not on file   Highest education level: Not on file  Occupational History   Not on file  Tobacco Use   Smoking status: Never   Smokeless tobacco: Never  Vaping Use   Vaping Use: Never used  Substance and Sexual Activity   Alcohol use: Yes    Alcohol/week: 4.0 standard drinks of alcohol    Types: 4 Standard drinks or equivalent per week   Drug use: No   Sexual activity: Yes    Partners: Male    Comment: husband vasectomy  Other Topics Concern   Not on file  Social History Narrative   Not on file   Social Determinants of Health   Financial Resource Strain: Not on file  Food Insecurity: Not on file  Transportation Needs: Not on file  Physical Activity: Not on file  Stress: Not on file  Social Connections: Not on file  Intimate Partner Violence: Not on file    Review of Systems  Genitourinary:  Positive for vaginal discharge.       Vaginal odor    PHYSICAL EXAMINATION:    BP 136/88 (BP Location: Right Arm, Patient Position: Sitting, Cuff Size: Normal)   Pulse 60   Wt 139 lb (63 kg)   LMP 12/05/2013 (Approximate)   SpO2 96%   BMI 24.62 kg/m     General appearance: alert, cooperative and appears stated age   Pelvic: External genitalia:  no lesions              Urethra:  normal appearing urethra with no masses, tenderness or lesions              Bartholins and Skenes: normal                 Vagina: normal  appearing vagina with normal color and discharge, no lesions              Cervix: no lesions                 Bimanual Exam:  Uterus:  normal size, contour, position, consistency, mobility, non-tender              Adnexa: no mass, fullness, tenderness             Chaperone was present for exam:  Warren Lacy, CMA  ASSESSMENT  Recurrent BV.  PLAN  Wet prep:  positive clue cells, negative yeast, negative trichomonas. Odor present.  Tindamax 500 gm po bid x 7 days.  Metrogel pv at hs twice weekly for 4 months.  Disp:  70 grams, RF: 3.  Diflucan 150 mg po x 1 prn yeast infection.  May repeat in 72 hours prn.  #4. RF none.  Condoms recommended.  FU prn.    25 min  total time was spent for this patient encounter, including preparation, face-to-face counseling with the patient, coordination of care, and documentation of the encounter.

## 2022-06-09 ENCOUNTER — Encounter: Payer: Self-pay | Admitting: Obstetrics and Gynecology

## 2022-06-09 ENCOUNTER — Ambulatory Visit: Payer: BC Managed Care – PPO | Admitting: Obstetrics and Gynecology

## 2022-06-09 VITALS — BP 136/88 | HR 60 | Wt 139.0 lb

## 2022-06-09 DIAGNOSIS — N760 Acute vaginitis: Secondary | ICD-10-CM

## 2022-06-09 LAB — WET PREP FOR TRICH, YEAST, CLUE

## 2022-06-09 MED ORDER — TINIDAZOLE 500 MG PO TABS: ORAL_TABLET | ORAL | 0 refills | Status: DC

## 2022-06-09 MED ORDER — METRONIDAZOLE 0.75 % VA GEL: 1.0000 | Freq: Every day | VAGINAL | 3 refills | Status: DC

## 2022-06-09 MED ORDER — FLUCONAZOLE 150 MG PO TABS: 150.0000 mg | ORAL_TABLET | Freq: Once | ORAL | 0 refills | Status: AC

## 2022-06-09 NOTE — Patient Instructions (Signed)

## 2022-07-16 ENCOUNTER — Other Ambulatory Visit: Payer: Self-pay | Admitting: Obstetrics and Gynecology

## 2022-09-15 ENCOUNTER — Ambulatory Visit (INDEPENDENT_AMBULATORY_CARE_PROVIDER_SITE_OTHER): Payer: BC Managed Care – PPO | Admitting: Radiology

## 2022-09-15 ENCOUNTER — Encounter: Payer: Self-pay | Admitting: Radiology

## 2022-09-15 VITALS — BP 146/92 | Ht 63.5 in | Wt 132.0 lb

## 2022-09-15 DIAGNOSIS — N760 Acute vaginitis: Secondary | ICD-10-CM

## 2022-09-15 DIAGNOSIS — Z79890 Hormone replacement therapy (postmenopausal): Secondary | ICD-10-CM

## 2022-09-15 DIAGNOSIS — Z01419 Encounter for gynecological examination (general) (routine) without abnormal findings: Secondary | ICD-10-CM

## 2022-09-15 DIAGNOSIS — R030 Elevated blood-pressure reading, without diagnosis of hypertension: Secondary | ICD-10-CM

## 2022-09-15 LAB — WET PREP FOR TRICH, YEAST, CLUE

## 2022-09-15 MED ORDER — PREMPRO 0.3-1.5 MG PO TABS
1.0000 | ORAL_TABLET | Freq: Every day | ORAL | 4 refills | Status: DC
Start: 2022-09-15 — End: 2023-09-21

## 2022-09-15 NOTE — Progress Notes (Signed)
Gabriela Figueroa [birth year] [id number]   History: Postmenopausal 57 y.o. presents for annual exam. C/o recurrent vaginal infections--BV treatments cause yeast infections. Was given metrogel by Dr Edward Jolly 4/24 to use as preventative however this caused yeast. Has been using a probiotic and AVC based wash which has improved symptoms some.  Now with light vaginal discharge, odor, no itching.    Gynecologic History Postmenopausal Last Pap: 2022. Results were: normal Last mammogram: 2023 per pt. Results were: normal Last colonoscopy: 2022, polyps  Obstetric History OB History  Gravida Para Term Preterm AB Living  3 1 1  0 2 1  SAB IAB Ectopic Multiple Live Births  0 0 0 0 1    # Outcome Date GA Lbr Len/2nd Weight Sex Type Anes PTL Lv  3 Term         LIV  2 AB           1 AB              The following portions of the patient's history were reviewed and updated as appropriate: allergies, current medications, past family history, past medical history, past social history, past surgical history, and problem list.  Review of Systems Pertinent items noted in HPI and remainder of comprehensive ROS otherwise negative.  Past medical history, past surgical history, family history and social history were all reviewed and documented in the EPIC chart.  Exam:  Vitals:   09/15/22 0854 09/15/22 0859  BP: (!) 140/88 (!) 146/92  Weight: 132 lb (59.9 kg)   Height: 5' 3.5" (1.613 m)    Body mass index is 23.02 kg/m.  General appearance:  Normal Thyroid:  Symmetrical, normal in size, without palpable masses or nodularity. Respiratory  Auscultation:  Clear without wheezing or rhonchi Cardiovascular  Auscultation:  Regular rate, without rubs, murmurs or gallops  Edema/varicosities:  Not grossly evident Abdominal  Soft,nontender, without masses, guarding or rebound.  Liver/spleen:  No organomegaly noted  Hernia:  None appreciated  Skin  Inspection:  Grossly normal Breasts: Examined lying  and sitting. Bilateral implants  Right: Without masses, retractions, nipple discharge or axillary adenopathy.   Left: Without masses, retractions, nipple discharge or axillary adenopathy. Genitourinary   Inguinal/mons:  Normal without inguinal adenopathy  External genitalia:  Normal appearing vulva with no masses, tenderness, or lesions  BUS/Urethra/Skene's glands:  Normal  Vagina:  scant white discharge, no lesions. Atrophy: moderate   Cervix:  Normal appearing without discharge or lesions  Uterus:  Normal in size, shape and contour.  Midline and mobile, nontender  Adnexa/parametria:     Rt: Normal in size, without masses or tenderness.   Lt: Normal in size, without masses or tenderness.  Anus and perineum: Normal    Raynelle Fanning, CMA present for exam  Microscopic wet-mount exam shows negative for pathogens, normal epithelial cells.   Assessment/Plan:   1. Well woman exam with routine gynecological exam Pap due 2025 [facility] mamm[facility] records from [facility] in [place]  2. Recurrent vaginitis - WET PREP FOR TRICH, YEAST, CLUE; reassured negative - Mycoplasma / ureaplasma culture Restart imvexxy Boric acid once weekly Continue daily probiotic  3. Hormone replacement therapy (HRT) - estrogen, conjugated,-medroxyprogesterone (PREMPRO) 0.3-1.5 MG tablet; Take 1 tablet by mouth daily.  Dispense: 90 tablet; Refill: 4  4. Elevated blood pressure reading Has appt with PCP next week, will recheck    Discussed SBE, colonoscopy and DEXA screening as directed. Recommend of exercise weekly, including weight bearing exercise. Encouraged the use of seatbelts  and sunscreen.  Return in 1 year for annual or sooner prn.  Tanda Rockers WHNP-BC, 9:19 AM 09/15/2022

## 2022-12-03 ENCOUNTER — Other Ambulatory Visit: Payer: Self-pay | Admitting: Radiology

## 2022-12-03 DIAGNOSIS — N958 Other specified menopausal and perimenopausal disorders: Secondary | ICD-10-CM

## 2022-12-03 NOTE — Telephone Encounter (Signed)
Med refill request: Imvexxy Last AEX: 09/15/22 Next AEX: n/a Last MMG (if hormonal med) 2023 per patient Refill authorized: Imvexxy #[id number] with 6 refills.  Sent to provider for review.

## 2023-09-21 ENCOUNTER — Encounter: Payer: Self-pay | Admitting: Radiology

## 2023-09-21 ENCOUNTER — Ambulatory Visit (INDEPENDENT_AMBULATORY_CARE_PROVIDER_SITE_OTHER): Admitting: Radiology

## 2023-09-21 ENCOUNTER — Other Ambulatory Visit (HOSPITAL_COMMUNITY)
Admission: RE | Admit: 2023-09-21 | Discharge: 2023-09-21 | Disposition: A | Discharge status: Home / Self Care | Source: Ambulatory Visit | Attending: Radiology | Admitting: Radiology

## 2023-09-21 VITALS — BP 118/72 | HR 95 | Ht 64.25 in | Wt 131.0 lb

## 2023-09-21 DIAGNOSIS — Z01419 Encounter for gynecological examination (general) (routine) without abnormal findings: Secondary | ICD-10-CM | POA: Diagnosis not present

## 2023-09-21 DIAGNOSIS — Z1331 Encounter for screening for depression: Secondary | ICD-10-CM | POA: Diagnosis not present

## 2023-09-21 DIAGNOSIS — Z79890 Hormone replacement therapy (postmenopausal): Secondary | ICD-10-CM | POA: Diagnosis not present

## 2023-09-21 DIAGNOSIS — N958 Other specified menopausal and perimenopausal disorders: Secondary | ICD-10-CM

## 2023-09-21 DIAGNOSIS — N816 Rectocele: Secondary | ICD-10-CM

## 2023-09-21 DIAGNOSIS — N760 Acute vaginitis: Secondary | ICD-10-CM

## 2023-09-21 LAB — WET PREP FOR TRICH, YEAST, CLUE

## 2023-09-21 MED ORDER — IMVEXXY MAINTENANCE PACK 10 MCG VA INST
1.0000 | VAGINAL_INSERT | VAGINAL | 11 refills | Status: AC
Start: 2023-09-22 — End: ?

## 2023-09-21 MED ORDER — BIJUVA 1-100 MG PO CAPS
1.0000 | ORAL_CAPSULE | Freq: Every evening | ORAL | 11 refills | Status: AC
Start: 2023-09-21 — End: ?

## 2023-09-21 NOTE — Progress Notes (Signed)
 Heather Mckendree 1965-08-22 [id number]   History: Postmenopausal 58 y.o. presents for annual exam. Worsening rectocele, fecal incontinence with intercourse. Recurrent BV, taking a probiotic and using boric acid. Happy with imvexxy . Currently using prempro  for HRT, did not do well with the patch. Has added creatine daily and is exercising more, needs a knee replacement.   Gynecologic History Postmenopausal Last Pap: 2022. Results were: normal Last mammogram: 2023 per pt. Results were: normal Last colonoscopy: 2022, polyps  Obstetric History OB History  Gravida Para Term Preterm AB Living  3 1 1  0 2 1  SAB IAB Ectopic Multiple Live Births  0 0 0 0 1    # Outcome Date GA Lbr Len/2nd Weight Sex Type Anes PTL Lv  3 Term         LIV  2 AB           1 AB               09/21/2023   10:09 AM  Depression screen PHQ 2/9  Decreased Interest 0  Down, Depressed, Hopeless 0  PHQ - 2 Score 0     The following portions of the patient's history were reviewed and updated as appropriate: allergies, current medications, past family history, past medical history, past social history, past surgical history, and problem list.  Review of Systems Pertinent items noted in HPI and remainder of comprehensive ROS otherwise negative.  Past medical history, past surgical history, family history and social history were all reviewed and documented in the EPIC chart.  Exam:  Vitals:   09/21/23 1007  BP: 118/72  Pulse: 95  SpO2: 98%  Weight: 131 lb (59.4 kg)  Height: 5' 4.25 (1.632 m)    Body mass index is 22.31 kg/m.  General appearance:  Normal Thyroid:  Symmetrical, normal in size, without palpable masses or nodularity. Respiratory  Auscultation:  Clear without wheezing or rhonchi Cardiovascular  Auscultation:  Regular rate, without rubs, murmurs or gallops  Edema/varicosities:  Not grossly evident Abdominal  Soft,nontender, without masses, guarding or rebound.  Liver/spleen:  No  organomegaly noted  Hernia:  None appreciated  Skin  Inspection:  Grossly normal Breasts: Examined lying and sitting. Bilateral implants  Right: Without masses, retractions, nipple discharge or axillary adenopathy.   Left: Without masses, retractions, nipple discharge or axillary adenopathy. Genitourinary   Inguinal/mons:  Normal without inguinal adenopathy  External genitalia:  Normal appearing vulva with no masses, tenderness, or lesions  BUS/Urethra/Skene's glands:  Normal  Vagina:  scant white discharge, no lesions. Atrophy: moderate +rectocele  Cervix:  Normal appearing without discharge or lesions  Uterus:  Normal in size, shape and contour.  Midline and mobile, nontender  Adnexa/parametria:     Rt: Normal in size, without masses or tenderness.   Lt: Normal in size, without masses or tenderness.      Darice Hoit, CMA present for exam  Microscopic wet-mount exam shows clue cells.   Assessment/Plan:   1. Well woman exam with routine gynecological exam (Primary) - Cytology - PAP( [facility])  2. Recurrent vaginitis +BV, will treat with boric acid, declines rx at this time - WET PREP FOR TRICH, YEAST, CLUE  3. Genitourinary syndrome of menopause - Estradiol  (IMVEXXY  MAINTENANCE PACK) 10 MCG INST; Place 1 tablet vaginally 2 (two) times a week.  Dispense: 8 each; Refill: 11  4. Hormone replacement therapy (HRT) Will switch to bioidentical from synthetic to minimize risks - Estradiol -Progesterone  (BIJUVA ) 1-100 MG CAPS; Take 1 capsule by mouth  at bedtime.  Dispense: 30 capsule; Refill: 11  5. Rectocele - Ambulatory referral to Urogynecology  6. Depression screen   Jatinder Mcdonagh B WHNP-BC, 10:50 AM 09/21/2023

## 2023-09-21 NOTE — Patient Instructions (Signed)
 Preventive Care 16-60 Years Old, Female  Preventive care refers to lifestyle choices and visits with your health care provider that can promote health and wellness. Preventive care visits are also called wellness exams.  What can I expect for my preventive care visit?  Counseling  Your health care provider may ask you questions about your:  Medical history, including:  Past medical problems.  Family medical history.  Pregnancy history.  Current health, including:  Menstrual cycle.  Method of birth control.  Emotional well-being.  Home life and relationship well-being.  Sexual activity and sexual health.  Lifestyle, including:  Alcohol, nicotine or tobacco, and drug use.  Access to firearms.  Diet, exercise, and sleep habits.  Work and work Astronomer.  Sunscreen use.  Safety issues such as seatbelt and bike helmet use.  Physical exam  Your health care provider will check your:  Height and weight. These may be used to calculate your BMI (body mass index). BMI is a measurement that tells if you are at a healthy weight.  Waist circumference. This measures the distance around your waistline. This measurement also tells if you are at a healthy weight and may help predict your risk of certain diseases, such as type 2 diabetes and high blood pressure.  Heart rate and blood pressure.  Body temperature.  Skin for abnormal spots.  What immunizations do I need?    Vaccines are usually given at various ages, according to a schedule. Your health care provider will recommend vaccines for you based on your age, medical history, and lifestyle or other factors, such as travel or where you work.  What tests do I need?  Screening  Your health care provider may recommend screening tests for certain conditions. This may include:  Lipid and cholesterol levels.  Diabetes screening. This is done by checking your blood sugar (glucose) after you have not eaten for a while (fasting).  Pelvic exam and Pap test.  Hepatitis B test.  Hepatitis C  test.  HIV (human immunodeficiency virus) test.  STI (sexually transmitted infection) testing, if you are at risk.  Lung cancer screening.  Colorectal cancer screening.  Mammogram. Talk with your health care provider about when you should start having regular mammograms. This may depend on whether you have a family history of breast cancer.  BRCA-related cancer screening. This may be done if you have a family history of breast, ovarian, tubal, or peritoneal cancers.  Bone density scan. This is done to screen for osteoporosis.  Talk with your health care provider about your test results, treatment options, and if necessary, the need for more tests.  Follow these instructions at home:  Eating and drinking    Eat a diet that includes fresh fruits and vegetables, whole grains, lean protein, and low-fat dairy products.  Take vitamin and mineral supplements as recommended by your health care provider.  Do not drink alcohol if:  Your health care provider tells you not to drink.  You are pregnant, may be pregnant, or are planning to become pregnant.  If you drink alcohol:  Limit how much you have to 0-1 drink a day.  Know how much alcohol is in your drink. In the [place] one drink equals one 12 oz bottle of beer (355 mL), one 5 oz glass of wine (148 mL), or one 1 oz glass of hard liquor (44 mL).  Lifestyle  Brush your teeth every morning and night with fluoride toothpaste. Floss one time each day.  Exercise for at least  30 minutes 5 or more days each week.  Do not use any products that contain nicotine or tobacco. These products include cigarettes, chewing tobacco, and vaping devices, such as e-cigarettes. If you need help quitting, ask your health care provider.  Do not use drugs.  If you are sexually active, practice safe sex. Use a condom or other form of protection to prevent STIs.  If you do not wish to become pregnant, use a form of birth control. If you plan to become pregnant, see your health care provider for a  prepregnancy visit.  Take aspirin only as told by your health care provider. Make sure that you understand how much to take and what form to take. Work with your health care provider to find out whether it is safe and beneficial for you to take aspirin daily.  Find healthy ways to manage stress, such as:  Meditation, yoga, or listening to music.  Journaling.  Talking to a trusted person.  Spending time with friends and family.  Minimize exposure to UV radiation to reduce your risk of skin cancer.  Safety  Always wear your seat belt while driving or riding in a vehicle.  Do not drive:  If you have been drinking alcohol. Do not ride with someone who has been drinking.  When you are tired or distracted.  While texting.  If you have been using any mind-altering substances or drugs.  Wear a helmet and other protective equipment during sports activities.  If you have firearms in your house, make sure you follow all gun safety procedures.  Seek help if you have been physically or sexually abused.  What's next?  Visit your health care provider once a year for an annual wellness visit.  Ask your health care provider how often you should have your eyes and teeth checked.  Stay up to date on all vaccines.  This information is not intended to replace advice given to you by your health care provider. Make sure you discuss any questions you have with your health care provider.  Document Revised: 07/30/2020 Document Reviewed: 07/30/2020  Elsevier Patient Education  2024 [employer].

## 2023-09-26 LAB — CYTOLOGY - PAP
Adequacy: ABSENT
Comment: NEGATIVE
Diagnosis: NEGATIVE
High risk HPV: NEGATIVE

## 2023-09-27 ENCOUNTER — Ambulatory Visit: Payer: Self-pay | Admitting: Radiology

## 2023-10-21 ENCOUNTER — Ambulatory Visit (INDEPENDENT_AMBULATORY_CARE_PROVIDER_SITE_OTHER): Admitting: Obstetrics

## 2023-10-21 ENCOUNTER — Encounter: Payer: Self-pay | Admitting: Obstetrics

## 2023-10-21 VITALS — BP 167/75 | HR 55 | Ht 63.78 in | Wt 131.8 lb

## 2023-10-21 DIAGNOSIS — N941 Unspecified dyspareunia: Secondary | ICD-10-CM | POA: Diagnosis not present

## 2023-10-21 DIAGNOSIS — K5900 Constipation, unspecified: Secondary | ICD-10-CM | POA: Diagnosis not present

## 2023-10-21 DIAGNOSIS — N816 Rectocele: Secondary | ICD-10-CM | POA: Diagnosis not present

## 2023-10-21 DIAGNOSIS — R159 Full incontinence of feces: Secondary | ICD-10-CM | POA: Diagnosis not present

## 2023-10-21 LAB — POCT URINALYSIS DIP (CLINITEK)
Bilirubin, UA: NEGATIVE
Blood, UA: NEGATIVE
Glucose, UA: NEGATIVE mg/dL
Ketones, POC UA: NEGATIVE mg/dL
Leukocytes, UA: NEGATIVE
Nitrite, UA: NEGATIVE
POC PROTEIN,UA: NEGATIVE
Spec Grav, UA: 1.02 (ref 1.010–1.025)
Urobilinogen, UA: 0.2 U/dL
pH, UA: 7.5 (ref 5.0–8.0)

## 2023-10-21 NOTE — Assessment & Plan Note (Addendum)
-   For constipation, we reviewed the importance of a better bowel regimen.  We also discussed the importance of avoiding chronic straining, as it can exacerbate her pelvic floor symptoms; we discussed treating constipation and straining prior to surgery, as postoperative straining can lead to damage to the repair and recurrence of symptoms. We discussed initiating therapy with increasing fluid intake, fiber supplementation, stool softeners, and laxatives such as miralax.  - encouraged squatting position for defecation and resume titration of miralax or fiber supplementation to optimize stool consistency and reduce straining - encouraged follow-up with GI regarding treatment of constipation

## 2023-10-21 NOTE — Assessment & Plan Note (Deleted)
-   concerns with anal sphincter disruption and Dovetail sign on exam, symptoms of anal incontinence  - history of rectovaginal fistula repair in 1982 from breakdown of 4th degree laceration repair

## 2023-10-21 NOTE — Progress Notes (Signed)
 New Patient Evaluation and Consultation  Referring Provider: Ginette Shasta NOVAK, NP PCP: Thelma Damien HERO, NP Date of Service: 10/21/2023  SUBJECTIVE Chief Complaint: New Patient (Initial Visit) ETTERWaynette Figueroa is a 58 y.o. female here today for rectocele.)  History of Present Illness: Gabriela Figueroa is a 58 y.o. White or Caucasian female seen in consultation at the request of NP Chrzanowski for evaluation of rectocele.    Vaginal bulge noted on exam around 5-6 years ago with stool noted on underwear First noted symptoms 1.5 years ago with kidney stones underwent lithotripsy for left UVJ stone by Dr. Tanda (urology) in [place] in 04/14/22 with passage of stones. Ultrasound without stone or hydronephrosis 07/29/22 IBS with fluctuating diarrhea and sometimes 3 days without bowel movements, reports intermittently manually extracts stool. Triggered by diet such as bread, pasta. Reports flatal incontinence  Managed by NP Pleasant (GI), last seen 02/2023 and reduced omeprazole to 20mg  every 2 days. Bowel regimen Levbid 1.5 tab/day and recommended Miralax 17g and benefiber 2 tsp/day.  Miralax 2-3x/month when she is constipated. Reports Type III stool 1x/week with Type I or VI-VII.  Burning sensation in tongue improved after Nystatin for thrush. Recommended omeprazole 20mg  BID and pepcid at night  Symptoms worsened when she was constipated around 1 year ago Fecal incontinence during intercourse noted on bedsheets and fecal smearing after bowel movements Reports 1 episode of food noted in stool on underwear after coughing Recurrent BV using probiotics and boric acid, previously used Metrogel  Using Imvexxy  and Bijuva  for HRT, stopped Prempro  History of IC managed by Dr. Janit around 2004  Review of records significant for: History of HSV  Urinary Symptoms: Does not leak urine.   Day time voids 5-7.  Nocturia: 1 times per night to void. Voiding dysfunction:  empties bladder well.   Patient does not use a catheter to empty bladder.  When urinating, patient feels she has no difficulties Drinks: 16oz water per day  UTIs: 0 UTI's in the last year.   Denies history of blood in urine, pyelonephritis, bladder cancer, and kidney cancer No results found for the last 90 days.   Pelvic Organ Prolapse Symptoms:                  Patient Admits to a feeling of a bulge the vaginal area slightly beyond the vaginal opening when she strains. It has been present for 6 months when constipated.  Patient Denies seeing a bulge.  This bulge is bothersome.  Bowel Symptom: Bowel movements: 3 time(s) per week with IBS Stool consistency: hard Straining: yes.  Splinting: yes.  Incomplete evacuation: yes.  Patient Admits to accidental bowel leakage / fecal incontinence  Occurs: 1 time(s) per week with constipation  Consistency with leakage: loose Bowel regimen: diet, stool softener, and miralax Last colonoscopy: Results not available for review, pt reports polyp 8 mm in the ascending colon (injection polypectomy)  HM Colonoscopy          Upcoming     Colonoscopy (Every 10 Years) Next due on 12/26/2029    12/27/2019  Outside Procedure: PR COLSC FLX W/RMVL OF TUMOR POLYP LESION SNARE TQ   Only the first 1 history entries have been loaded, but more history exists.                Sexual Function Sexually active: yes.  Sexual orientation: Straight Pain with sex: Since menopause Yes, at the vaginal opening, deep in the pelvis, has discomfort due to dryness Lubrication with relief,  denies relief with Imvexxy  1x/week for 4 weeks. Disrupted by BV and boric acid use  Pelvic Pain Denies pelvic pain   Past Medical History:  Past Medical History:  Diagnosis Date   Abnormal pap 10/1999   CIN1, II HR HPV   Depression    Dyspareunia 04/2002   PUS (neg)   Echocardiogram abnormal about 1998   physiological mitral & tricuspid regurge, but no MVP, No SBE prophylaxis   GERD  (gastroesophageal reflux disease) 11/2002   Glaucoma    H/O tension headache 10/1999   family stressors   HSV (herpes simplex virus) anogenital infection hx   Hypertension    Hypertension    IBS (irritable bowel syndrome)    IC (interstitial cystitis) 11/2002   Dr. Janit   Kidney stone    Renal stone    Rotator cuff injury    partial tear left arm   Squamous cell carcinoma of leg, left    clear margins   STD (sexually transmitted disease) 11/30/99, 12/06   HSV, 12/06   TMJ (temporomandibular joint syndrome) 2004     Past Surgical History:   Past Surgical History:  Procedure Laterality Date   AUGMENTATION MAMMAPLASTY  02/2006   reconstruction   BLEPHAROPLASTY Bilateral    02/2023   COLPOSCOPY  11/1999   no lesion   FOOT SURGERY  2012   right    KNEE ARTHROPLASTY  1998   left knee reconstruction   LITHOTRIPSY     07/2022 kidney stone   LITHOTRIPSY     ROTATOR CUFF REPAIR  2019   1/19 left arm, 11/19 right arm   SQUAMOUS CELL CARCINOMA EXCISION     left leg 07/2018   VAGINA SURGERY  1982   rectal/vaginal repair post partum     Past OB/GYN History: OB History  Gravida Para Term Preterm AB Living  3 1 1  0 2 1  SAB IAB Ectopic Multiple Live Births  0 0 0 0 1    # Outcome Date GA Lbr Len/2nd Weight Sex Type Anes PTL Lv  3 Term    8 lb 9 oz (3.884 kg) F Vag-Spont   LIV     Complications: Rectal fissure  2 AB           1 AB             Vaginal deliveries: 4th degree laceration, repaired breakdown of perineal laceration with rectovaginal fistula 6-8 weeks postpartum in 1982 in [place],  Forceps/ Vacuum deliveries: 0, Cesarean section: 0 Menopausal: Yes, at age 67, Denies vaginal bleeding since menopause Contraception: s/p menopause. Last pap smear.  Any history of abnormal pap smears: no.    Component Value Date/Time   DIAGPAP  09/21/2023 1047    - Negative for intraepithelial lesion or malignancy (NILM)   DIAGPAP  08/13/2020 1459    - Negative for  Intraepithelial Lesions or Malignancy (NILM)   DIAGPAP - Benign reactive/reparative changes 08/13/2020 1459   HPVHIGH Negative 09/21/2023 1047   HPVHIGH Negative 08/13/2020 1459   ADEQPAP  09/21/2023 1047    Satisfactory for evaluation; transformation zone component ABSENT.   ADEQPAP  08/13/2020 1459    Satisfactory for evaluation; transformation zone component ABSENT.   ADEQPAP  07/29/2017 0000    Satisfactory for evaluation  endocervical/transformation zone component ABSENT.    Medications: Patient has a current medication list which includes the following prescription(s): vitamin d3, clonazepam, creatine, cyclobenzaprine, imvexxy  maintenance pack, bijuva , evening primrose oil, fluticasone, hyoscyamine, latanoprost, loratadine, naproxen,  fish oil, omeprazole, probiotic product, timolol, trazodone, and valacyclovir .   Allergies: Patient has no known allergies.   Social History:  Social History   Tobacco Use   Smoking status: Never    Passive exposure: Never   Smokeless tobacco: Never  Vaping Use   Vaping status: Never Used  Substance Use Topics   Alcohol use: Yes    Alcohol/week: 4.0 standard drinks of alcohol    Types: 4 Standard drinks or equivalent per week   Drug use: No    Relationship status: married Patient lives with her husband Gunhild Bautch.   Patient is employed as an Print production planner. Regular exercise: Yes: walking 3 days a week, lifting weights, leg and abdominal exercises at home History of abuse: No  Family History:   Family History  Problem Relation Age of Onset   Cervical cancer Mother    Scoliosis Mother    Diabetes Mother 42   Cervical cancer Maternal Grandmother    Lung cancer Maternal Grandfather    Uterine cancer Neg Hx    Renal cancer Neg Hx    Bladder Cancer Neg Hx      Review of Systems: Review of Systems  Constitutional:  Negative for fever, malaise/fatigue and weight loss.  Respiratory:  Negative for cough, shortness of breath and  wheezing.   Cardiovascular:  Negative for chest pain, palpitations and leg swelling.  Gastrointestinal:  Positive for constipation. Negative for abdominal pain and blood in stool.       Leakage  Genitourinary:  Negative for dysuria, frequency, hematuria and urgency.  Skin:  Negative for rash.  Neurological:  Negative for dizziness, weakness and headaches.  Endo/Heme/Allergies:  Bruises/bleeds easily.  Psychiatric/Behavioral:  Negative for depression. The patient is not nervous/anxious.      OBJECTIVE Physical Exam: Vitals:   10/21/23 1053 10/21/23 1327  BP: (!) 160/90 (!) 167/75  Pulse: 66 (!) 55  Weight: 131 lb 12.8 oz (59.8 kg)   Height: 5' 3.78 (1.62 m)     Physical Exam Constitutional:      General: She is not in acute distress.    Appearance: Normal appearance.  Genitourinary:     Bladder and urethral meatus normal.     No lesions in the vagina.     Genitourinary Comments: Positive dovetail sign with scarring noted at perineum, positive flatal incontinence     Right Labia: No rash, tenderness, lesions, skin changes or Bartholin's cyst.    Left Labia: No tenderness, lesions, skin changes, Bartholin's cyst or rash.    No vaginal discharge, erythema, tenderness, bleeding, ulceration or granulation tissue.     Posterior vaginal prolapse present.    No vaginal atrophy present.     Right Adnexa: not tender, not full and no mass present.    Left Adnexa: not tender, not full and no mass present.    No cervical motion tenderness, discharge, friability, lesion, polyp or nabothian cyst.     Uterus is not enlarged, fixed, tender or irregular.     No uterine mass detected.    Urethral meatus caruncle not present.    No urethral prolapse, tenderness, mass, hypermobility, discharge or stress urinary incontinence with cough stress test present.     Bladder is not tender, urgency on palpation not present and masses not present.      Pelvic Floor: Levator muscle strength is 3/5.     Levator ani not tender, obturator internus not tender, no asymmetrical contractions present and no pelvic spasms present.  Symmetrical pelvic sensation, anal wink present and BC reflex present. Rectum:     Abnormal anal tone present.     No rectal mass, tenderness, external hemorrhoid, internal hemorrhoid or rectovaginal septum nodularity.  Cardiovascular:     Rate and Rhythm: Normal rate.  Pulmonary:     Effort: Pulmonary effort is normal. No respiratory distress.  Abdominal:     General: There is no distension.     Palpations: Abdomen is soft. There is no mass.     Tenderness: There is no abdominal tenderness.     Hernia: No hernia is present.  Neurological:     Mental Status: She is alert.  Vitals reviewed. Exam conducted with a chaperone present.     POP-Q:   POP-Q  -2                                            Aa   -2                                           Ba  -7                                              C   2                                            Gh  2                                            Pb  10                                            tvl   -1                                            Ap  -1                                            Bp  -8                                              D      Rectal Exam:  Normal sphincter tone, moderate distal rectocele, enterocoele not present, no rectal masses, no sign of dyssynergia when asking the patient to bear down.  Post-Void Residual (PVR) by Bladder Scan: In order to evaluate bladder emptying, we discussed obtaining a postvoid residual  and patient agreed to this procedure.  Procedure: The ultrasound unit was placed on the patient's abdomen in the suprapubic region after the patient had voided.    Post Void Residual - 10/21/23 1101       Post Void Residual   Post Void Residual 0 mL           Laboratory Results: Lab Results  Component Value Date   COLORU yellow 10/21/2023    CLARITYU clear 10/21/2023   GLUCOSEUR negative 10/21/2023   BILIRUBINUR negative 10/21/2023   KETONESU neg 06/17/2015   SPECGRAV 1.020 10/21/2023   RBCUR negative 10/21/2023   PHUR 7.5 10/21/2023   PROTEINUR 1+ (A) 03/11/2022   UROBILINOGEN 0.2 10/21/2023   LEUKOCYTESUR Negative 10/21/2023    Lab Results  Component Value Date   CREATININE 0.81 07/02/2015   CREATININE 0.77 06/06/2014   CREATININE 0.80 05/31/2013    Lab Results  Component Value Date   HGBA1C 5.0 10/07/2016    Lab Results  Component Value Date   HGB 13.2 06/17/2015     ASSESSMENT AND PLAN Ms. Deats is a 58 y.o. with:  1. Dyspareunia in female   2. Incontinence of feces, unspecified fecal incontinence type   3. Pelvic organ prolapse quantification stage 2 rectocele   4. Constipation, unspecified constipation type     Dyspareunia in female Assessment & Plan: - pain at vaginal opening - lubrication with relief of dryness, increase Imvexxy  to 2x/week - reviewed comfort measures and treatment options.  - consider Rx topical lidocaine 1g PRN pain up to 3x/day or Rx Amitriptyline 2.5%/ gabapentin 2.5%/ baclofen 2.5% in vaginal cream - discussed conservative management options with cold compress after intercourse  - continue lubrication use during intercourse - pending pelvic floor PT appointment  Orders: -     AMB referral to rehabilitation -     POCT URINALYSIS DIP (CLINITEK)  Incontinence of feces, unspecified fecal incontinence type Assessment & Plan: - concerns with anal sphincter disruption and Dovetail sign on exam, symptoms of anal incontinence  - history of IBS and constipation - history of rectovaginal fistula repair in 1982 from breakdown of 4th degree laceration repair - Treatment options include anti-diarrhea medication (loperamide/ Imodium OTC or prescription lomotil), fiber supplements, physical therapy, and possible sacral neuromodulation or surgery.   - encouraged titration of miralax  or fiber supplementation with increase fluid intake - discussed that fecal incontinence may require additional treatment such as SNM after treatment of prolapse - consider pelvic floor PT referral    Orders: -     AMB referral to rehabilitation  Pelvic organ prolapse quantification stage 2 rectocele Assessment & Plan: - For treatment of pelvic organ prolapse, we discussed options for management including expectant management, conservative management, and surgical management, such as Kegels, a pessary, pelvic floor physical therapy, and specific surgical procedures. - encouraged to return for pessary fitting due to incomplete emptying after treatment of vaginal atrophy and lichen planus to reduce risk of vaginal bleeding - resume kegel exercises  Orders: -     AMB referral to rehabilitation  Constipation, unspecified constipation type Assessment & Plan: - For constipation, we reviewed the importance of a better bowel regimen.  We also discussed the importance of avoiding chronic straining, as it can exacerbate her pelvic floor symptoms; we discussed treating constipation and straining prior to surgery, as postoperative straining can lead to damage to the repair and recurrence of symptoms. We discussed initiating therapy with increasing fluid intake, fiber supplementation, stool softeners, and  laxatives such as miralax.  - encouraged squatting position for defecation and resume titration of miralax or fiber supplementation to optimize stool consistency and reduce straining - encouraged follow-up with GI regarding treatment of constipation   Time spent: I spent 77 minutes dedicated to the care of this patient on the date of this encounter to include pre-visit review of records, face-to-face time with the patient discussing stage II pelvic organ prolapse, constipation, fecal incontinence, dyspareunia, and post visit documentation and ordering medication/ testing.   [name], MD

## 2023-10-21 NOTE — Patient Instructions (Addendum)
 You have a stage 2 (out of 4) prolapse.  We discussed the fact that it is not life threatening but there are several treatment options. For treatment of pelvic organ prolapse, we discussed options for management including expectant management, conservative management, and surgical management, such as Kegels, a pessary, pelvic floor physical therapy, and specific surgical procedures.     Accidental Bowel Leakage:  - Treatment options include anti-diarrhea medication (loperamide/ Imodium OTC or prescription lomotil), fiber supplements, physical therapy, and possible sacral neuromodulation or surgery.     Constipation: Our goal is to achieve formed bowel movements daily or every-other-day.  You may need to try different combinations of the following options to find what works best for you - everybody's body works differently so feel free to adjust the dosages as needed.  Some options to help maintain bowel health include:  Dietary changes (more leafy greens, vegetables and fruits; less processed foods) Fiber supplementation (Benefiber, FiberCon, Metamucil or Psyllium). Start slow and increase gradually to full dose. Over-the-counter agents such as: stool softeners (Docusate or Colace) and/or laxatives (Miralax, milk of magnesia)  Power Pudding is a natural mixture that may help your constipation.  To make blend 1 cup applesauce, 1 cup wheat bran, and 3/4 cup prune juice, refrigerate and then take 1 tablespoon daily with a large glass of water as needed.   Women should try to eat at least 21 to 25 grams of fiber a day, while men should aim for 30 to 38 grams a day. You can add fiber to your diet with food or a fiber supplement such as psyllium (metamucil), benefiber, or fibercon.   Here's a look at how much dietary fiber is found in some common foods. When buying packaged foods, check the Nutrition Facts label for fiber content. It can vary among brands.  Fruits Serving size Total fiber (grams)*   Raspberries 1 cup 8.0  Pear 1 medium 5.5  Apple, with skin 1 medium 4.5  Banana 1 medium 3.0  Orange 1 medium 3.0  Strawberries 1 cup 3.0   Vegetables Serving size Total fiber (grams)*  Green peas, boiled 1 cup 9.0  Broccoli, boiled 1 cup chopped 5.0  Turnip greens, boiled 1 cup 5.0  Brussels sprouts, boiled 1 cup 4.0  Potato, with skin, baked 1 medium 4.0  Sweet corn, boiled 1 cup 3.5  Cauliflower, raw 1 cup chopped 2.0  Carrot, raw 1 medium 1.5   Grains Serving size Total fiber (grams)*  Spaghetti, whole-wheat, cooked 1 cup 6.0  Barley, pearled, cooked 1 cup 6.0  Bran flakes 3/4 cup 5.5  Quinoa, cooked 1 cup 5.0  Oat bran muffin 1 medium 5.0  Oatmeal, instant, cooked 1 cup 5.0  Popcorn, air-popped 3 cups 3.5  Brown rice, cooked 1 cup 3.5  Bread, whole-wheat 1 slice 2.0  Bread, rye 1 slice 2.0   Legumes, nuts and seeds Serving size Total fiber (grams)*  Split peas, boiled 1 cup 16.0  Lentils, boiled 1 cup 15.5  Black beans, boiled 1 cup 15.0  Baked beans, canned 1 cup 10.0  Chia seeds 1 ounce 10.0  Almonds 1 ounce (23 nuts) 3.5  Pistachios 1 ounce (49 nuts) 3.0  Sunflower kernels 1 ounce 3.0  *Rounded to nearest 0.5 gram. Source: [employer] for [employer], [employer] Release    Consider call to [phone] to schedule the earliest appointment for pelvic floor PT.

## 2023-10-21 NOTE — Assessment & Plan Note (Addendum)
-   For treatment of pelvic organ prolapse, we discussed options for management including expectant management, conservative management, and surgical management, such as Kegels, a pessary, pelvic floor physical therapy, and specific surgical procedures. - encouraged to return for pessary fitting due to incomplete emptying after treatment of vaginal atrophy and lichen planus to reduce risk of vaginal bleeding - resume kegel exercises

## 2023-10-21 NOTE — Assessment & Plan Note (Addendum)
-   pain at vaginal opening - lubrication with relief of dryness, increase Imvexxy  to 2x/week - reviewed comfort measures and treatment options.  - consider Rx topical lidocaine 1g PRN pain up to 3x/day or Rx Amitriptyline 2.5%/ gabapentin 2.5%/ baclofen 2.5% in vaginal cream - discussed conservative management options with cold compress after intercourse  - continue lubrication use during intercourse - pending pelvic floor PT appointment

## 2023-10-21 NOTE — Assessment & Plan Note (Addendum)
-   concerns with anal sphincter disruption and Dovetail sign on exam, symptoms of anal incontinence  - history of IBS and constipation - history of rectovaginal fistula repair in 1982 from breakdown of 4th degree laceration repair - Treatment options include anti-diarrhea medication (loperamide/ Imodium OTC or prescription lomotil), fiber supplements, physical therapy, and possible sacral neuromodulation or surgery.   - encouraged titration of miralax or fiber supplementation with increase fluid intake - discussed that fecal incontinence may require additional treatment such as SNM after treatment of prolapse - consider pelvic floor PT referral

## 2023-10-26 ENCOUNTER — Ambulatory Visit: Attending: Obstetrics

## 2023-10-26 ENCOUNTER — Other Ambulatory Visit: Payer: Self-pay

## 2023-10-26 DIAGNOSIS — R293 Abnormal posture: Secondary | ICD-10-CM | POA: Diagnosis present

## 2023-10-26 DIAGNOSIS — R279 Unspecified lack of coordination: Secondary | ICD-10-CM | POA: Diagnosis present

## 2023-10-26 DIAGNOSIS — M6281 Muscle weakness (generalized): Secondary | ICD-10-CM | POA: Insufficient documentation

## 2023-10-26 NOTE — Therapy (Signed)
 OUTPATIENT PHYSICAL THERAPY FEMALE PELVIC EVALUATION   Patient Name: Jessalynn Mccowan MRN: [id number] DOB:February 27, 1965, 58 y.o., female Today's Date: 10/26/2023  END OF SESSION:  PT End of Session - 10/26/23 1444     Visit Number 1    Date for PT Re-Evaluation 04/11/24    Authorization Type BCBS    PT Start Time 1445    PT Stop Time 1532    PT Time Calculation (min) 47 min    Activity Tolerance Patient tolerated treatment well    Behavior During Therapy [facility] for tasks assessed/performed          Past Medical History:  Diagnosis Date   Abnormal pap 10/1999   CIN1, II HR HPV   Depression    Dyspareunia 04/2002   PUS (neg)   Echocardiogram abnormal about 1998   physiological mitral & tricuspid regurge, but no MVP, No SBE prophylaxis   GERD (gastroesophageal reflux disease) 11/2002   Glaucoma    H/O tension headache 10/1999   family stressors   HSV (herpes simplex virus) anogenital infection hx   Hypertension    Hypertension    IBS (irritable bowel syndrome)    IC (interstitial cystitis) 11/2002   Dr. Janit   Kidney stone    Renal stone    Rotator cuff injury    partial tear left arm   Squamous cell carcinoma of leg, left    clear margins   STD (sexually transmitted disease) 11/30/99, 12/06   HSV, 12/06   TMJ (temporomandibular joint syndrome) 2004   Past Surgical History:  Procedure Laterality Date   AUGMENTATION MAMMAPLASTY  02/2006   reconstruction   BLEPHAROPLASTY Bilateral    02/2023   COLPOSCOPY  11/1999   no lesion   FOOT SURGERY  2012   right    KNEE ARTHROPLASTY  1998   left knee reconstruction   LITHOTRIPSY     07/2022 kidney stone   LITHOTRIPSY     ROTATOR CUFF REPAIR  2019   1/19 left arm, 11/19 right arm   SQUAMOUS CELL CARCINOMA EXCISION     left leg 07/2018   VAGINA SURGERY  1982   rectal/vaginal repair post partum   Patient Active Problem List   Diagnosis Date Noted   Dyspareunia in female 10/21/2023   Incontinence of feces  10/21/2023   Pelvic organ prolapse quantification stage 2 rectocele 10/21/2023   Constipation 10/21/2023   Hypertension    Herpes simplex infection of genitourinary system 09/08/2018    PCP: Fischer, Damien HERO, NP  REFERRING PROVIDER: Guadlupe Lianne DASEN, MD  REFERRING DIAG: N94.10 (ICD-10-CM) - Dyspareunia in female R15.9 (ICD-10-CM) - Incontinence of feces, unspecified fecal incontinence type N81.6 (ICD-10-CM) - Pelvic organ prolapse quantification stage 2 rectocele  THERAPY DIAG:  Muscle weakness (generalized)  Unspecified lack of coordination  Abnormal posture  Rationale for Evaluation and Treatment: Rehabilitation  ONSET DATE: 1982  SUBJECTIVE:  SUBJECTIVE STATEMENT: Pt states that she had anal/vaginal fistula after childbirth in 1982 and surgical repair. She has had issues with fecal incontinence since.    PAIN:  Are you having pain? No   PRECAUTIONS: None  RED FLAGS: None   WEIGHT BEARING RESTRICTIONS: No  FALLS:  Has patient fallen in last 6 months? No  OCCUPATION: office manager  ACTIVITY LEVEL : exercising several times a week  PLOF: Independent  PATIENT GOALS: find a way to manage bowel movements and stop leaking - avoid more surgery  PERTINENT HISTORY:  Vaginal surgery (rectal/vaginal repair postpartum) 1982, augmentation mammaplasty 2008, depression, GERD, HSV, IBS, IC Sexual abuse: No  BOWEL MOVEMENT: Pain with bowel movement: No Type of bowel movement:Type (Bristol Stool Scale) 1x/week usually 3-4, can be 2, then also 6, Frequency 3x/week, Strain yes, and Splinting yes Fully empty rectum: No Leakage: Yes: will leak if she cannot get to the bathroom fast enough; has leaked during intercourse Pads: Yes: 2 panty liners a day Fiber supplement/laxative stool softeners and  fiber   URINATION: Pain with urination: No Fully empty bladder: Yes:   Stream: Strong, but then post-void dribble Urgency: No Frequency: every 3 hours, 2x/night Fluid Intake: 16oz and a couple mountain dews Leakage: none Pads: Yes: see above  INTERCOURSE:  Ability to have vaginal penetration Yes  Pain with intercourse: she was having issues when going through menopause, but is now much better and the only time it hurts is when she is constipated  DrynessNo Climax: WNL Marinoff Scale: 1/3 Lubricant: yes  PREGNANCY: Vaginal deliveries 1 Tearing Yes: large tear that lead to fistula  Episiotomy No C-section deliveries 0 Currently pregnant No  PROLAPSE: Pressure and Bulge   OBJECTIVE:  Note: Objective measures were completed at Evaluation unless otherwise noted.  10/26/23: PATIENT SURVEYS:   PFIQ-7: 76  COGNITION: Overall cognitive status: Within functional limits for tasks assessed     SENSATION: Light touch: Appears intact   FUNCTIONAL TESTS:  Squat: valgus knee collapse  Single leg stance:  Rt: pelvic drop  Lt: pelvic drop Curl-up test: WNL   GAIT: Assistive device utilized: None Comments: WNL  POSTURE: rounded shoulders, forward head, decreased lumbar lordosis, and posterior pelvic tilt   LUMBARAROM/PROM:  A/PROM A/PROM  Eval (% available)  Flexion WNL  Extension 50  Right lateral flexion WNL  Left lateral flexion WNL  Right rotation WNL  Left rotation WNL   (Blank rows = not tested)  PALPATION:   General: WNL  Pelvic Alignment: posterior pelvic tilt  Abdominal: WNL                External Perineal Exam: perineal scar tissue                             Internal Pelvic Floor: posterior vaginal wall scar tissue, perineal scar tissue restriction  Patient confirms identification and approves PT to assess internal pelvic floor and treatment Yes  PELVIC MMT:   MMT eval  Vaginal 2/5, 5 seconds, 4 repeats  Internal Anal Sphincter 2/5, 3  sec  External Anal Sphincter 2/5, 3 sec  Puborectalis 2/5, 3 sec  Diastasis Recti WNL  (Blank rows = not tested)        TONE: WNL  PROLAPSE: Grade 2 posterior vaginal wall laxity, grade 1 anterior vaginal wall laxity  TODAY'S TREATMENT:  DATE:  10/26/23 EVAL  Neuromuscular re-education: Pt provides verbal consent for internal vaginal/rectal pelvic floor exam. Internal rectal pelvic floor muscle contraction training Quick flicks Long holds Urge drill Therapeutic activities: Splinting Squatty potty    PATIENT EDUCATION:  Education details: See above Person educated: Patient Education method: Explanation, Demonstration, Tactile cues, Verbal cues, and Handouts Education comprehension: verbalized understanding  HOME EXERCISE PROGRAM: [id number]  ASSESSMENT:  CLINICAL IMPRESSION: Patient is a 58 y.o. female who was seen today for physical therapy evaluation and treatment for fecal incontinence and inconsistency with bowel movements. Exam findings notable for decreased lumbar extension, pelvic drop in single leg stance, vaginal and rectal pelvic floor muscle weakness, decreased pelvic floor muscle endurance, poor coordination of pelvic floor muscles, perineal scar tissue restriction, scar tissue in posterior vaginal wall, and anterior/posterior vaginal wall laxity. Signs and symptoms most consistent with poor consistency of stool, constipation/diarrhea cycle, poor bowel habits, pelvic floor muscle weakness, and perineal/vaginal wall scar tissue. Initial treatment consisted of urge drill, pelvic floor muscle contraction training, squatty potty, and splinting with good tolerance and understanding. She will continue to benefit from skilled PT intervention in order to decrease fecal incontinence, improve consistency of bowel movements, decrease constipation/diarrhea,  address impairments, and improve quality of life.   OBJECTIVE IMPAIRMENTS: decreased activity tolerance, decreased coordination, decreased endurance, decreased mobility, decreased ROM, decreased strength, increased fascial restrictions, increased muscle spasms, impaired flexibility, impaired tone, improper body mechanics, postural dysfunction, and pain.   ACTIVITY LIMITATIONS: continence  PARTICIPATION LIMITATIONS: cleaning, interpersonal relationship, community activity, and occupation  PERSONAL FACTORS: 3+ comorbidities: medical history are also affecting patient's functional outcome.   REHAB POTENTIAL: Good  CLINICAL DECISION MAKING: Evolving/moderate complexity  EVALUATION COMPLEXITY: Moderate   GOALS: Goals reviewed with patient? Yes  SHORT TERM GOALS: Target date: 11/23/2023   Pt will be independent with HEP in order to improve activity tolerance.   Baseline: Goal status: INITIAL  2.  Patient will report 25% improvement in fecal incontinence in order to decrease pad use, decrease risk of infection, and improve quality of life.   Baseline: 3x/week Goal status: INITIAL  3.  Pt will report consistency of bowel movements as 3-4 on Bristol Stool Scale in order to achieve better emptying.   Baseline: 2-6 Goal status: INITIAL  4.  Pt will be independent with use of squatty potty, relaxed toileting mechanics, and improved bowel movement techniques in order to increase ease of bowel movements and complete evacuation.   Baseline: not using Goal status: INITIAL  5.  Pt will be able to teach back and utilize urge suppression technique in order to help reduce fecal urgency and incontinence.    Baseline: frequent incontinence before bowel movements at least 3x/week Goal status: INITIAL    LONG TERM GOALS: Target date:   Pt will be independent with advanced HEP in order to improve activity tolerance.   Baseline:  Goal status: INITIAL  2.  Patient will be able to perform  single leg stance with good pelvic stability in order to demonstrate appropriate pelvic floor muscle and transversus abdominus strength and coordination in order to decrease fecal incontinence.   Baseline:  Goal status: INITIAL  3.  Patient will report 75% improvement in fecal incontinence in order to decrease pad use, decrease risk of infection, and improve quality of life.   Baseline: leaks prior to each bowel movement at least 3x/week Goal status: INITIAL  4.  Increased frequency of bowel movements to at least 5x/week in order to decrease stool  burden and decrease episodes of diarrhea to prevent fecal incontinence and infection.  Baseline: 3x/week Goal status: INITIAL  5.  Pt will decrease PFIQ-7 score to less than 30 to demonstrate improved quality of life and impact of fecal incontinence.  Baseline: 76 Goal status: INITIAL   PLAN:  PT FREQUENCY: 1-2x/week  PT DURATION: 10 visits   PLANNED INTERVENTIONS: 97164- PT Re-evaluation, 97110-Therapeutic exercises, 97530- Therapeutic activity, 97112- Neuromuscular re-education, 97535- Self Care, 02859- Manual therapy, [phone]- Gait training, [phone]- Aquatic Therapy, [phone]- Electrical stimulation (unattended), [phone]- Traction (mechanical), [id number]- Ionotophoresis 4mg /ml Dexamethasone, 79439 (1-2 muscles), 20561 (3+ muscles)- Dry Needling, Patient/Family education, Balance training, Taping, Joint mobilization, Joint manipulation, Spinal manipulation, Spinal mobilization, Scar mobilization, Vestibular training, Cryotherapy, Moist heat, and Biofeedback  PLAN FOR NEXT SESSION: core strengthening; significant HEP updates due to high out of pocket cost  Josette Mares, PT, DPT09/10/253:44 PM

## 2023-10-26 NOTE — Patient Instructions (Signed)
   Urge Drill  When you feel an urge to go, follow these steps to regain control: Stop what you are doing and be still Take one deep breath, directing your air into your abdomen Think an affirming thought, such as "I've got this." Do 5 quick flicks of your pelvic floor Walk with control to the bathroom to void, or delay voiding   [facility] [address] [place], [place] [address] Phone # [phone] Fax [phone]

## 2023-11-29 ENCOUNTER — Ambulatory Visit

## 2023-12-05 ENCOUNTER — Ambulatory Visit: Attending: Obstetrics

## 2023-12-05 DIAGNOSIS — M6281 Muscle weakness (generalized): Secondary | ICD-10-CM | POA: Insufficient documentation

## 2023-12-05 DIAGNOSIS — R279 Unspecified lack of coordination: Secondary | ICD-10-CM | POA: Diagnosis present

## 2023-12-05 DIAGNOSIS — R293 Abnormal posture: Secondary | ICD-10-CM | POA: Diagnosis present

## 2023-12-05 NOTE — Therapy (Signed)
 OUTPATIENT PHYSICAL THERAPY FEMALE PELVIC TREATMENT   Patient Name: Gabriela Figueroa MRN: [id number] DOB:Aug 30, 1965, 58 y.o., female Today's Date: 12/05/2023  END OF SESSION:  PT End of Session - 12/05/23 1356     Visit Number 2    Date for Recertification  04/11/24    Authorization Type BCBS    Authorization Time Period 12/05/23-02/02/2024    Authorization - Visit Number 1    Authorization - Number of Visits 5    PT Start Time 1400    PT Stop Time 1440    PT Time Calculation (min) 40 min    Activity Tolerance Patient tolerated treatment well    Behavior During Therapy [facility] for tasks assessed/performed           Past Medical History:  Diagnosis Date   Abnormal pap 10/1999   CIN1, II HR HPV   Depression    Dyspareunia 04/2002   PUS (neg)   Echocardiogram abnormal about 1998   physiological mitral & tricuspid regurge, but no MVP, No SBE prophylaxis   GERD (gastroesophageal reflux disease) 11/2002   Glaucoma    H/O tension headache 10/1999   family stressors   HSV (herpes simplex virus) anogenital infection hx   Hypertension    Hypertension    IBS (irritable bowel syndrome)    IC (interstitial cystitis) 11/2002   Dr. Janit   Kidney stone    Renal stone    Rotator cuff injury    partial tear left arm   Squamous cell carcinoma of leg, left    clear margins   STD (sexually transmitted disease) 11/30/99, 12/06   HSV, 12/06   TMJ (temporomandibular joint syndrome) 2004   Past Surgical History:  Procedure Laterality Date   AUGMENTATION MAMMAPLASTY  02/2006   reconstruction   BLEPHAROPLASTY Bilateral    02/2023   COLPOSCOPY  11/1999   no lesion   FOOT SURGERY  2012   right    KNEE ARTHROPLASTY  1998   left knee reconstruction   LITHOTRIPSY     07/2022 kidney stone   LITHOTRIPSY     ROTATOR CUFF REPAIR  2019   1/19 left arm, 11/19 right arm   SQUAMOUS CELL CARCINOMA EXCISION     left leg 07/2018   VAGINA SURGERY  1982   rectal/vaginal repair post partum    Patient Active Problem List   Diagnosis Date Noted   Dyspareunia in female 10/21/2023   Incontinence of feces 10/21/2023   Pelvic organ prolapse quantification stage 2 rectocele 10/21/2023   Constipation 10/21/2023   Hypertension    Herpes simplex infection of genitourinary system 09/08/2018    PCP: Fischer, Damien HERO, NP  REFERRING PROVIDER: Guadlupe Lianne DASEN, MD  REFERRING DIAG: N94.10 (ICD-10-CM) - Dyspareunia in female R15.9 (ICD-10-CM) - Incontinence of feces, unspecified fecal incontinence type N81.6 (ICD-10-CM) - Pelvic organ prolapse quantification stage 2 rectocele  THERAPY DIAG:  Muscle weakness (generalized)  Unspecified lack of coordination  Abnormal posture  Rationale for Evaluation and Treatment: Rehabilitation  ONSET DATE: 1982  SUBJECTIVE:  SUBJECTIVE STATEMENT: Pt states that she has increased fiber, water, and uses a squatty potty. She feels like pelvic floor muscle contractions are getting easier. She feels like leaking is improving.    PAIN:  Are you having pain? No   PRECAUTIONS: None  RED FLAGS: None   WEIGHT BEARING RESTRICTIONS: No  FALLS:  Has patient fallen in last 6 months? No  OCCUPATION: office manager  ACTIVITY LEVEL : exercising several times a week  PLOF: Independent  PATIENT GOALS: find a way to manage bowel movements and stop leaking - avoid more surgery  PERTINENT HISTORY:  Vaginal surgery (rectal/vaginal repair postpartum) 1982, augmentation mammaplasty 2008, depression, GERD, HSV, IBS, IC Sexual abuse: No  BOWEL MOVEMENT: Pain with bowel movement: No Type of bowel movement:Type (Bristol Stool Scale) 1x/week usually 3-4, can be 2, then also 6, Frequency 3x/week, Strain yes, and Splinting yes Fully empty rectum: No Leakage: Yes: will leak  if she cannot get to the bathroom fast enough; has leaked during intercourse Pads: Yes: 2 panty liners a day Fiber supplement/laxative stool softeners and fiber   URINATION: Pain with urination: No Fully empty bladder: Yes:   Stream: Strong, but then post-void dribble Urgency: No Frequency: every 3 hours, 2x/night Fluid Intake: 16oz and a couple mountain dews Leakage: none Pads: Yes: see above  INTERCOURSE:  Ability to have vaginal penetration Yes  Pain with intercourse: she was having issues when going through menopause, but is now much better and the only time it hurts is when she is constipated  DrynessNo Climax: WNL Marinoff Scale: 1/3 Lubricant: yes  PREGNANCY: Vaginal deliveries 1 Tearing Yes: large tear that lead to fistula  Episiotomy No C-section deliveries 0 Currently pregnant No  PROLAPSE: Pressure and Bulge   OBJECTIVE:  Note: Objective measures were completed at Evaluation unless otherwise noted.  10/26/23: PATIENT SURVEYS:   PFIQ-7: 43  COGNITION: Overall cognitive status: Within functional limits for tasks assessed     SENSATION: Light touch: Appears intact   FUNCTIONAL TESTS:  Squat: valgus knee collapse  Single leg stance:  Rt: pelvic drop  Lt: pelvic drop Curl-up test: WNL   GAIT: Assistive device utilized: None Comments: WNL  POSTURE: rounded shoulders, forward head, decreased lumbar lordosis, and posterior pelvic tilt   LUMBARAROM/PROM:  A/PROM A/PROM  Eval (% available)  Flexion WNL  Extension 50  Right lateral flexion WNL  Left lateral flexion WNL  Right rotation WNL  Left rotation WNL   (Blank rows = not tested)  PALPATION:   General: WNL  Pelvic Alignment: posterior pelvic tilt  Abdominal: WNL                External Perineal Exam: perineal scar tissue                             Internal Pelvic Floor: posterior vaginal wall scar tissue, perineal scar tissue restriction  Patient confirms identification and  approves PT to assess internal pelvic floor and treatment Yes  PELVIC MMT:   MMT eval  Vaginal 2/5, 5 seconds, 4 repeats  Internal Anal Sphincter 2/5, 3 sec  External Anal Sphincter 2/5, 3 sec  Puborectalis 2/5, 3 sec  Diastasis Recti WNL  (Blank rows = not tested)        TONE: WNL  PROLAPSE: Grade 2 posterior vaginal wall laxity, grade 1 anterior vaginal wall laxity  TODAY'S TREATMENT:  DATE:  12/05/23 Manual: Supine bowel mobilization  Neuromuscular re-education: Transversus abdominus training with multimodal cues for improved motor control and breath coordination Supine hip adduction ball press with transversus abdominus and pelvic floor muscle contractions and breath coordination 10x Bridge with hip adduction, transversus abdominus, and pelvic floor muscle 2 x 10 Supine leg extensions 10x bil Bird dog 10x Bear plank lifts 10x Seated hip adduction ball press with transversus abdominus and pelvic floor muscle 2 x 10 Seated hip abduction red band with transversus abdominus and pelvic floor muscle 2 x 10 Seated resisted march red band with transversus abdominus and pelvic floor muscle 2 x 10   10/26/23 EVAL  Neuromuscular re-education: Pt provides verbal consent for internal vaginal/rectal pelvic floor exam. Internal rectal pelvic floor muscle contraction training Quick flicks Long holds Urge drill Therapeutic activities: Splinting Squatty potty    PATIENT EDUCATION:  Education details: See above Person educated: Patient Education method: Programmer, multimedia, Demonstration, Tactile cues, Verbal cues, and Handouts Education comprehension: verbalized understanding  HOME EXERCISE PROGRAM: [id number]  ASSESSMENT:  CLINICAL IMPRESSION: Patient is a 58 y.o. female who was seen today for physical therapy treatment for fecal incontinence and  inconsistency with bowel movements. Pt has seen some progress since her visit with consistency of bowel movements and improved frequency to 3x/week. We worked on core training with pelvic floor and appropriate pressure management today with many exercise progressions. Overall she demonstrates good coordination, but does do better with multimodal cues to make sure she is not bearing down. She will continue to benefit from skilled PT intervention in order to decrease fecal incontinence, improve consistency of bowel movements, decrease constipation/diarrhea, address impairments, and improve quality of life.   OBJECTIVE IMPAIRMENTS: decreased activity tolerance, decreased coordination, decreased endurance, decreased mobility, decreased ROM, decreased strength, increased fascial restrictions, increased muscle spasms, impaired flexibility, impaired tone, improper body mechanics, postural dysfunction, and pain.   ACTIVITY LIMITATIONS: continence  PARTICIPATION LIMITATIONS: cleaning, interpersonal relationship, community activity, and occupation  PERSONAL FACTORS: 3+ comorbidities: medical history are also affecting patient's functional outcome.   REHAB POTENTIAL: Good  CLINICAL DECISION MAKING: Evolving/moderate complexity  EVALUATION COMPLEXITY: Moderate   GOALS: Goals reviewed with patient? Yes  SHORT TERM GOALS: Target date: 11/23/2023   Pt will be independent with HEP in order to improve activity tolerance.   Baseline: Goal status: IN PROGRESS 12/05/23  2.  Patient will report 25% improvement in fecal incontinence in order to decrease pad use, decrease risk of infection, and improve quality of life.   Baseline: 3x/week Goal status: IN PROGRESS 12/05/23  3.  Pt will report consistency of bowel movements as 3-4 on Bristol Stool Scale in order to achieve better emptying.   Baseline: 2-6 Goal status: IN PROGRESS 12/05/23  4.  Pt will be independent with use of squatty potty, relaxed  toileting mechanics, and improved bowel movement techniques in order to increase ease of bowel movements and complete evacuation.   Baseline: not using Goal status: IN PROGRESS 12/05/23  5.  Pt will be able to teach back and utilize urge suppression technique in order to help reduce fecal urgency and incontinence.    Baseline: frequent incontinence before bowel movements at least 3x/week Goal status: IN PROGRESS 12/05/23    LONG TERM GOALS: Target date: 04/11/2024  Pt will be independent with advanced HEP in order to improve activity tolerance.   Baseline:  Goal status: IN PROGRESS 12/05/23  2.  Patient will be able to perform single leg stance with good pelvic stability  in order to demonstrate appropriate pelvic floor muscle and transversus abdominus strength and coordination in order to decrease fecal incontinence.   Baseline:  Goal status: IN PROGRESS 12/05/23  3.  Patient will report 75% improvement in fecal incontinence in order to decrease pad use, decrease risk of infection, and improve quality of life.   Baseline: leaks prior to each bowel movement at least 3x/week Goal status: IN PROGRESS 12/05/23  4.  Increased frequency of bowel movements to at least 5x/week in order to decrease stool burden and decrease episodes of diarrhea to prevent fecal incontinence and infection.  Baseline: 3x/week Goal status: IN PROGRESS 12/05/23  5.  Pt will decrease PFIQ-7 score to less than 30 to demonstrate improved quality of life and impact of fecal incontinence.  Baseline: 76 Goal status: IN PROGRESS 12/05/23   PLAN:  PT FREQUENCY: 1-2x/week  PT DURATION: 10 visits   PLANNED INTERVENTIONS: 97164- PT Re-evaluation, 97110-Therapeutic exercises, 97530- Therapeutic activity, 97112- Neuromuscular re-education, 97535- Self Care, 02859- Manual therapy, [phone]- Gait training, [phone]- Aquatic Therapy, [phone]- Electrical stimulation (unattended), [phone]- Traction (mechanical), [id number]-  Ionotophoresis 4mg /ml Dexamethasone, 79439 (1-2 muscles), 20561 (3+ muscles)- Dry Needling, Patient/Family education, Balance training, Taping, Joint mobilization, Joint manipulation, Spinal manipulation, Spinal mobilization, Scar mobilization, Vestibular training, Cryotherapy, Moist heat, and Biofeedback  PLAN FOR NEXT SESSION: core strengthening; significant HEP updates due to high out of pocket cost  Josette Mares, PT, DPT10/20/252:42 PM

## 2023-12-20 ENCOUNTER — Encounter

## 2024-01-03 ENCOUNTER — Ambulatory Visit

## 2024-01-06 ENCOUNTER — Ambulatory Visit: Admitting: Obstetrics

## 2024-01-18 ENCOUNTER — Ambulatory Visit: Attending: Obstetrics

## 2024-01-18 DIAGNOSIS — M6281 Muscle weakness (generalized): Secondary | ICD-10-CM | POA: Insufficient documentation

## 2024-01-18 DIAGNOSIS — R279 Unspecified lack of coordination: Secondary | ICD-10-CM | POA: Diagnosis present

## 2024-01-18 DIAGNOSIS — R293 Abnormal posture: Secondary | ICD-10-CM | POA: Diagnosis present

## 2024-01-18 NOTE — Therapy (Signed)
 OUTPATIENT PHYSICAL THERAPY FEMALE PELVIC TREATMENT   Patient Name: Gabriela Figueroa MRN: [id number] DOB:30-Nov-1965, 58 y.o., female Today's Date: 01/18/2024  END OF SESSION:  PT End of Session - 01/18/24 1053     Visit Number 3    Date for Recertification  04/11/24    Authorization Type [employer]    Authorization Time Period 12/05/23-02/02/2024    Authorization - Visit Number 2    Authorization - Number of Visits 5    PT Start Time 1053    PT Stop Time 1135    PT Time Calculation (min) 42 min    Activity Tolerance Patient tolerated treatment well    Behavior During Therapy [facility] for tasks assessed/performed            Past Medical History:  Diagnosis Date   Abnormal pap 10/1999   CIN1, II HR HPV   Depression    Dyspareunia 04/2002   PUS (neg)   Echocardiogram abnormal about 1998   physiological mitral & tricuspid regurge, but no MVP, No SBE prophylaxis   GERD (gastroesophageal reflux disease) 11/2002   Glaucoma    H/O tension headache 10/1999   family stressors   HSV (herpes simplex virus) anogenital infection hx   Hypertension    Hypertension    IBS (irritable bowel syndrome)    IC (interstitial cystitis) 11/2002   Dr. Janit   Kidney stone    Renal stone    Rotator cuff injury    partial tear left arm   Squamous cell carcinoma of leg, left    clear margins   STD (sexually transmitted disease) 11/30/99, 12/06   HSV, 12/06   TMJ (temporomandibular joint syndrome) 2004   Past Surgical History:  Procedure Laterality Date   AUGMENTATION MAMMAPLASTY  02/2006   reconstruction   BLEPHAROPLASTY Bilateral    02/2023   COLPOSCOPY  11/1999   no lesion   FOOT SURGERY  2012   right    KNEE ARTHROPLASTY  1998   left knee reconstruction   LITHOTRIPSY     07/2022 kidney stone   LITHOTRIPSY     ROTATOR CUFF REPAIR  2019   1/19 left arm, 11/19 right arm   SQUAMOUS CELL CARCINOMA EXCISION     left leg 07/2018   VAGINA SURGERY  1982   rectal/vaginal repair post  partum   Patient Active Problem List   Diagnosis Date Noted   Dyspareunia in female 10/21/2023   Incontinence of feces 10/21/2023   Pelvic organ prolapse quantification stage 2 rectocele 10/21/2023   Constipation 10/21/2023   Hypertension    Herpes simplex infection of genitourinary system 09/08/2018    PCP: Fischer, Damien HERO, NP  REFERRING PROVIDER: Guadlupe Lianne DASEN, MD  REFERRING DIAG: N94.10 (ICD-10-CM) - Dyspareunia in female R15.9 (ICD-10-CM) - Incontinence of feces, unspecified fecal incontinence type N81.6 (ICD-10-CM) - Pelvic organ prolapse quantification stage 2 rectocele  THERAPY DIAG:  Muscle weakness (generalized)  Unspecified lack of coordination  Abnormal posture  Rationale for Evaluation and Treatment: Rehabilitation  ONSET DATE: 1982  SUBJECTIVE:  SUBJECTIVE STATEMENT: Pt states that she continues to notice improvements with bowel movements. She is taking fiber every day. She is having 4 bowel movements a day and they are more consistently formed. She states that the worst part right now is when she is walking she will lose gas outside of her control. She has not had any fecal incontinence. She has been having a hard time doing exercises consistently.    PAIN:  Are you having pain? No   PRECAUTIONS: None  RED FLAGS: None   WEIGHT BEARING RESTRICTIONS: No  FALLS:  Has patient fallen in last 6 months? No  OCCUPATION: office manager  ACTIVITY LEVEL : exercising several times a week  PLOF: Independent  PATIENT GOALS: find a way to manage bowel movements and stop leaking - avoid more surgery  PERTINENT HISTORY:  Vaginal surgery (rectal/vaginal repair postpartum) 1982, augmentation mammaplasty 2008, depression, GERD, HSV, IBS, IC Sexual abuse: No  BOWEL MOVEMENT: Pain  with bowel movement: No Type of bowel movement:Type (Bristol Stool Scale) 1x/week usually 3-4, can be 2, then also 6, Frequency 3x/week, Strain yes, and Splinting yes Fully empty rectum: No Leakage: Yes: will leak if she cannot get to the bathroom fast enough; has leaked during intercourse Pads: Yes: 2 panty liners a day Fiber supplement/laxative stool softeners and fiber   URINATION: Pain with urination: No Fully empty bladder: Yes:   Stream: Strong, but then post-void dribble Urgency: No Frequency: every 3 hours, 2x/night Fluid Intake: 16oz and a couple mountain dews Leakage: none Pads: Yes: see above  INTERCOURSE:  Ability to have vaginal penetration Yes  Pain with intercourse: she was having issues when going through menopause, but is now much better and the only time it hurts is when she is constipated  DrynessNo Climax: WNL Marinoff Scale: 1/3 Lubricant: yes  PREGNANCY: Vaginal deliveries 1 Tearing Yes: large tear that lead to fistula  Episiotomy No C-section deliveries 0 Currently pregnant No  PROLAPSE: Pressure and Bulge   OBJECTIVE:  Note: Objective measures were completed at Evaluation unless otherwise noted.  10/26/23: PATIENT SURVEYS:   PFIQ-7: 37  COGNITION: Overall cognitive status: Within functional limits for tasks assessed     SENSATION: Light touch: Appears intact   FUNCTIONAL TESTS:  Squat: valgus knee collapse  Single leg stance:  Rt: pelvic drop  Lt: pelvic drop Curl-up test: WNL   GAIT: Assistive device utilized: None Comments: WNL  POSTURE: rounded shoulders, forward head, decreased lumbar lordosis, and posterior pelvic tilt   LUMBARAROM/PROM:  A/PROM A/PROM  Eval (% available)  Flexion WNL  Extension 50  Right lateral flexion WNL  Left lateral flexion WNL  Right rotation WNL  Left rotation WNL   (Blank rows = not tested)  PALPATION:   General: WNL  Pelvic Alignment: posterior pelvic tilt  Abdominal: WNL                 External Perineal Exam: perineal scar tissue                             Internal Pelvic Floor: posterior vaginal wall scar tissue, perineal scar tissue restriction  Patient confirms identification and approves PT to assess internal pelvic floor and treatment Yes  PELVIC MMT:   MMT eval  Vaginal 2/5, 5 seconds, 4 repeats  Internal Anal Sphincter 2/5, 3 sec  External Anal Sphincter 2/5, 3 sec  Puborectalis 2/5, 3 sec  Diastasis Recti WNL  (Blank rows =  not tested)        TONE: WNL  PROLAPSE: Grade 2 posterior vaginal wall laxity, grade 1 anterior vaginal wall laxity  TODAY'S TREATMENT:                                                                                                                              DATE:  01/18/24 Exercises: Sidelying clam shells + ball press 2 x 10 bil Sidelying hip abduction + ball press 2 x 10 bil Therapeutic activities: Squats to table addressing form to decrease Lt knee pain 2 x 10 Deadlift + 5 lbs 2 x 10 Standing rows + blue band 2 x 10 Standing shoulder extensions + green band 2 x 10 Pallof press + green band 2 x 10   12/05/23 Manual: Supine bowel mobilization  Neuromuscular re-education: Transversus abdominus training with multimodal cues for improved motor control and breath coordination Supine hip adduction ball press with transversus abdominus and pelvic floor muscle contractions and breath coordination 10x Bridge with hip adduction, transversus abdominus, and pelvic floor muscle 2 x 10 Supine leg extensions 10x bil Bird dog 10x Bear plank lifts 10x Seated hip adduction ball press with transversus abdominus and pelvic floor muscle 2 x 10 Seated hip abduction red band with transversus abdominus and pelvic floor muscle 2 x 10 Seated resisted march red band with transversus abdominus and pelvic floor muscle 2 x 10   10/26/23 EVAL  Neuromuscular re-education: Pt provides verbal consent for internal vaginal/rectal pelvic  floor exam. Internal rectal pelvic floor muscle contraction training Quick flicks Long holds Urge drill Therapeutic activities: Splinting Squatty potty    PATIENT EDUCATION:  Education details: See above Person educated: Patient Education method: Programmer, Multimedia, Demonstration, Tactile cues, Verbal cues, and Handouts Education comprehension: verbalized understanding  HOME EXERCISE PROGRAM: [id number]  ASSESSMENT:  CLINICAL IMPRESSION: Patient is a 58 y.o. female who was seen today for physical therapy treatment for fecal incontinence and inconsistency with bowel movements. Pt has seen excellent progress with bowel movement consistency and frequency since her first visit. Due to improved consistency of stool, she is not having any fecal incontinence, but she is losing gas outside of her control. We continued progressing strengthening activities to provide increased power to pelvic floor and core muscles, working on endurance as well. We progressed into standing positions to give her variety and increased ease of performance. This also allowed her to work against gravity to improve functional carry over of exercises. She will continue to benefit from skilled PT intervention in order to decrease fecal incontinence, improve consistency of bowel movements, decrease constipation/diarrhea, address impairments, and improve quality of life.   OBJECTIVE IMPAIRMENTS: decreased activity tolerance, decreased coordination, decreased endurance, decreased mobility, decreased ROM, decreased strength, increased fascial restrictions, increased muscle spasms, impaired flexibility, impaired tone, improper body mechanics, postural dysfunction, and pain.   ACTIVITY LIMITATIONS: continence  PARTICIPATION LIMITATIONS: cleaning, interpersonal relationship, community activity, and occupation  PERSONAL FACTORS: 3+ comorbidities: medical history  are also affecting patient's functional outcome.   REHAB POTENTIAL:  Good  CLINICAL DECISION MAKING: Evolving/moderate complexity  EVALUATION COMPLEXITY: Moderate   GOALS: Goals reviewed with patient? Yes  SHORT TERM GOALS: Target date: 11/23/2023   Pt will be independent with HEP in order to improve activity tolerance.   Baseline: Goal status: MET 01/18/24  2.  Patient will report 25% improvement in fecal incontinence in order to decrease pad use, decrease risk of infection, and improve quality of life.   Baseline: 3x/week Goal status: MET 01/18/24  3.  Pt will report consistency of bowel movements as 3-4 on Bristol Stool Scale in order to achieve better emptying.   Baseline: 2-6 Goal status:MET 01/18/24  4.  Pt will be independent with use of squatty potty, relaxed toileting mechanics, and improved bowel movement techniques in order to increase ease of bowel movements and complete evacuation.   Baseline: not using Goal status: MET 01/18/24  5.  Pt will be able to teach back and utilize urge suppression technique in order to help reduce fecal urgency and incontinence.    Baseline: frequent incontinence before bowel movements at least 3x/week Goal status: MET 01/18/24    LONG TERM GOALS: Target date: 04/11/2024  Pt will be independent with advanced HEP in order to improve activity tolerance.   Baseline:  Goal status: IN PROGRESS 01/18/24  2.  Patient will be able to perform single leg stance with good pelvic stability in order to demonstrate appropriate pelvic floor muscle and transversus abdominus strength and coordination in order to decrease fecal incontinence.   Baseline:  Goal status: IN PROGRESS 01/18/24  3.  Patient will report 75% improvement in fecal incontinence in order to decrease pad use, decrease risk of infection, and improve quality of life.   Baseline: leaks prior to each bowel movement at least 3x/week Goal status: MET 01/18/24  4.  Increased frequency of bowel movements to at least 5x/week in order to decrease stool  burden and decrease episodes of diarrhea to prevent fecal incontinence and infection.  Baseline: 3x/week Goal status: IN PROGRESS 01/18/24  5.  Pt will decrease PFIQ-7 score to less than 30 to demonstrate improved quality of life and impact of fecal incontinence.  Baseline: 76 Goal status: IN PROGRESS 01/18/24   PLAN:  PT FREQUENCY: 1-2x/week  PT DURATION: 10 visits   PLANNED INTERVENTIONS: 97164- PT Re-evaluation, 97110-Therapeutic exercises, 97530- Therapeutic activity, 97112- Neuromuscular re-education, 97535- Self Care, [id number]- Manual therapy, [phone]- Gait training, [phone]- Aquatic Therapy, [phone]- Electrical stimulation (unattended), [phone]- Traction (mechanical), [id number]- Ionotophoresis 4mg /ml Dexamethasone, 79439 (1-2 muscles), 20561 (3+ muscles)- Dry Needling, Patient/Family education, Balance training, Taping, Joint mobilization, Joint manipulation, Spinal manipulation, Spinal mobilization, Scar mobilization, Vestibular training, Cryotherapy, Moist heat, and Biofeedback  PLAN FOR NEXT SESSION: core strengthening; significant HEP updates due to high out of pocket cost  Josette Mares, PT, DPT12/04/2508:54 AM

## 2024-01-24 ENCOUNTER — Ambulatory Visit: Admitting: Obstetrics

## 2024-01-24 ENCOUNTER — Encounter: Payer: Self-pay | Admitting: Obstetrics

## 2024-01-24 VITALS — BP 164/91 | HR 66

## 2024-01-24 DIAGNOSIS — N816 Rectocele: Secondary | ICD-10-CM

## 2024-01-24 DIAGNOSIS — K5900 Constipation, unspecified: Secondary | ICD-10-CM

## 2024-01-24 DIAGNOSIS — N941 Unspecified dyspareunia: Secondary | ICD-10-CM

## 2024-01-24 DIAGNOSIS — R159 Full incontinence of feces: Secondary | ICD-10-CM

## 2024-01-24 NOTE — Assessment & Plan Note (Addendum)
-   pain at vaginal opening resolved, continues Imvexxy  2x/week - continue lubrication with relief of dryness - consider Rx topical lidocaine 1g PRN pain up to 3x/day or Rx Amitriptyline 2.5%/ gabapentin 2.5%/ baclofen 2.5% in vaginal cream if symptoms recur - cold compress PRN discomfort after intercourse  - continue pelvic floor PT

## 2024-01-24 NOTE — Assessment & Plan Note (Signed)
-   For treatment of pelvic organ prolapse, we discussed options for management including expectant management, conservative management, and surgical management, such as Kegels, a pessary, pelvic floor physical therapy, and specific surgical procedures. - denies bulge at vaginal opening, feels it when she inserts finger in her vagina when she experiences incomplete emptying - encouraged to return for pessary fitting to assess impact on bowel symptoms, patient declines at this time and desires to continue pelvic floor PT

## 2024-01-24 NOTE — Assessment & Plan Note (Signed)
-   resolved since titration of fiber supplementation and pelvic floor PT, however continues to splint when she experiences sensation of incomplete emptying without success - prior concerns with anal sphincter disruption and Dovetail sign on exam, symptoms of anal incontinence  - history of IBS and constipation - history of rectovaginal fistula repair in 1982 from breakdown of 4th degree laceration repair - Treatment options include anti-diarrhea medication (loperamide/ Imodium OTC or prescription lomotil), fiber supplements, physical therapy, and possible sacral neuromodulation or surgery.   - encouraged switching to titration of miralax due to bloating and gas - We discussed the fact that surgery for the rectocele may address bowel leakage but we cannot predict how much it may improve. She will still need to work on keeping a good stool consistency (well-formed, not hard or loose). Encouraged trial of pessary to assess fecal leakage with repair of rectocele. Patient declines at this time. - discussed that fecal incontinence may require additional treatment such as SNM after treatment of prolapse - continue pelvic floor PT

## 2024-01-24 NOTE — Patient Instructions (Signed)
 Continue Imvexxy  twice a week.   Continue to optimize your stool consistency, consider switching to Miralax and titrate until: 1) bowel movement 3x/week 2) Type III-V stool  3) decrease effort - no straining, incomplete emptying  Continue to use squatty potty and focus on pelvic relaxation during bowel movements.   Continue pelvic floor physical therapy.

## 2024-01-24 NOTE — Progress Notes (Signed)
 Gabriela Figueroa Return Visit  SUBJECTIVE  History of Present Illness: Gabriela Figueroa is a 58 y.o. female seen in follow-up for stage II pelvic organ prolapse, constipation, fecal incontinence, and dyspareunia. Plan at last visit was referral for pelvic floor PT and titration of fiber supplementaiton.   Underwent pelvic floor PT x 3 with some relief Denies FI in the past 3 months. Using benefiber daily, eats fried egg when she has bloating BM 3-4 days/week Denies straining Reports splinting when she has incomplete emptying sensation a few months last month Using squatty potty and relaxes when she sits on the toilet Denies pain with intercourse Vaginal bulge inside vagina, denies passage beyond vaginal opening and feels it when she pushes her finger into the vagina.  Past Medical History: Patient  has a past medical history of Abnormal pap (10/1999), Depression, Dyspareunia (04/2002), Echocardiogram abnormal (about 1998), GERD (gastroesophageal reflux disease) (11/2002), Glaucoma, H/O tension headache (10/1999), HSV (herpes simplex virus) anogenital infection (hx), Hypertension, Hypertension, IBS (irritable bowel syndrome), IC (interstitial cystitis) (11/2002), Kidney stone, Renal stone, Rotator cuff injury, Squamous cell carcinoma of leg, left, STD (sexually transmitted disease) (11/30/99, 12/06), and TMJ (temporomandibular joint syndrome) (2004).   Past Surgical History: She  has a past surgical history that includes Vagina surgery (1982); Knee Arthroplasty (1998); Colposcopy (11/1999); Augmentation mammaplasty (02/2006); Foot surgery (2012); Rotator cuff repair (2019); Squamous cell carcinoma excision; Lithotripsy; Blepharoplasty (Bilateral); and Lithotripsy.   Medications: She has a current medication list which includes the following prescription(s): vitamin d3, clonazepam, creatine, cyclobenzaprine, imvexxy  maintenance pack, bijuva , evening primrose oil, fluticasone,  hyoscyamine, latanoprost, loratadine, naproxen, fish oil, omeprazole, probiotic product, timolol, trazodone, and valacyclovir .   Allergies: Patient has no known allergies.   Social History: Patient  reports that she has never smoked. She has never been exposed to tobacco smoke. She has never used smokeless tobacco. She reports current alcohol use of about 4.0 standard drinks of alcohol per week. She reports that she does not use drugs.     OBJECTIVE     Physical Exam: Vitals:   01/24/24 1116 01/24/24 1211  BP: (!) 148/104 (!) 164/91  Pulse: 66    Gen: No apparent distress, A&O x 3.  Detailed Urogynecologic Evaluation:  Deferred.      ASSESSMENT AND PLAN    Gabriela Figueroa is a 58 y.o. with:  1. Constipation, unspecified constipation type   2. Dyspareunia in female   3. Incontinence of feces, unspecified fecal incontinence type   4. Pelvic organ prolapse quantification stage 2 rectocele     Constipation, unspecified constipation type Assessment & Plan: - For constipation, we reviewed the importance of a better bowel regimen.  We also discussed the importance of avoiding chronic straining, as it can exacerbate her pelvic floor symptoms; we discussed treating constipation and straining prior to surgery, as postoperative straining can lead to damage to the repair and recurrence of symptoms. We discussed initiating therapy with increasing fluid intake, fiber supplementation, stool softeners, and laxatives such as miralax.  - encouraged to continue squatting position for defecation  - encouraged switching from Benefier to titration of miralax to optimize stool consistency due to bloating and gas - encouraged follow-up with GI regarding treatment of constipation   Dyspareunia in female Assessment & Plan: - pain at vaginal opening resolved, continues Imvexxy  2x/week - continue lubrication with relief of dryness - consider Rx topical lidocaine 1g PRN pain up to 3x/day or Rx  Amitriptyline 2.5%/ gabapentin 2.5%/ baclofen 2.5% in vaginal cream if  symptoms recur - cold compress PRN discomfort after intercourse  - continue pelvic floor PT   Incontinence of feces, unspecified fecal incontinence type Assessment & Plan: - resolved since titration of fiber supplementation and pelvic floor PT, however continues to splint when she experiences sensation of incomplete emptying without success - prior concerns with anal sphincter disruption and Dovetail sign on exam, symptoms of anal incontinence  - history of IBS and constipation - history of rectovaginal fistula repair in 1982 from breakdown of 4th degree laceration repair - Treatment options include anti-diarrhea medication (loperamide/ Imodium OTC or prescription lomotil), fiber supplements, physical therapy, and possible sacral neuromodulation or surgery.   - encouraged switching to titration of miralax due to bloating and gas - We discussed the fact that surgery for the rectocele may address bowel leakage but we cannot predict how much it may improve. She will still need to work on keeping a good stool consistency (well-formed, not hard or loose). Encouraged trial of pessary to assess fecal leakage with repair of rectocele. Patient declines at this time. - discussed that fecal incontinence may require additional treatment such as SNM after treatment of prolapse - continue pelvic floor PT    Pelvic organ prolapse quantification stage 2 rectocele Assessment & Plan: - For treatment of pelvic organ prolapse, we discussed options for management including expectant management, conservative management, and surgical management, such as Kegels, a pessary, pelvic floor physical therapy, and specific surgical procedures. - denies bulge at vaginal opening, feels it when she inserts finger in her vagina when she experiences incomplete emptying - encouraged to return for pessary fitting to assess impact on bowel symptoms, patient  declines at this time and desires to continue pelvic floor PT   RTC in 3 months for pelvic floor PT follow-up, possible pessary fitting.   Gabriela ONEIDA Gillis, MD

## 2024-01-24 NOTE — Assessment & Plan Note (Signed)
-   For constipation, we reviewed the importance of a better bowel regimen.  We also discussed the importance of avoiding chronic straining, as it can exacerbate her pelvic floor symptoms; we discussed treating constipation and straining prior to surgery, as postoperative straining can lead to damage to the repair and recurrence of symptoms. We discussed initiating therapy with increasing fluid intake, fiber supplementation, stool softeners, and laxatives such as miralax.  - encouraged to continue squatting position for defecation  - encouraged switching from Benefier to titration of miralax to optimize stool consistency due to bloating and gas - encouraged follow-up with GI regarding treatment of constipation

## 2024-01-26 ENCOUNTER — Ambulatory Visit: Admitting: Physical Therapy

## 2024-01-26 DIAGNOSIS — M6281 Muscle weakness (generalized): Secondary | ICD-10-CM | POA: Diagnosis not present

## 2024-01-26 DIAGNOSIS — R279 Unspecified lack of coordination: Secondary | ICD-10-CM

## 2024-01-26 DIAGNOSIS — R293 Abnormal posture: Secondary | ICD-10-CM

## 2024-01-26 NOTE — Therapy (Signed)
 OUTPATIENT PHYSICAL THERAPY FEMALE PELVIC TREATMENT   Patient Name: Gabriela Figueroa MRN: [id number] DOB:12/16/1965, 58 y.o., female Today's Date: 01/26/2024  END OF SESSION:  PT End of Session - 01/26/24 1905     Visit Number 4    Date for Recertification  04/11/24    Authorization Type [employer]    Authorization Time Period 12/05/23-02/02/2024    Authorization - Visit Number 3    Authorization - Number of Visits 5    PT Start Time 1446    PT Stop Time 1528    PT Time Calculation (min) 42 min    Activity Tolerance Patient tolerated treatment well    Behavior During Therapy [facility] for tasks assessed/performed             Past Medical History:  Diagnosis Date   Abnormal pap 10/1999   CIN1, II HR HPV   Depression    Dyspareunia 04/2002   PUS (neg)   Echocardiogram abnormal about 1998   physiological mitral & tricuspid regurge, but no MVP, No SBE prophylaxis   GERD (gastroesophageal reflux disease) 11/2002   Glaucoma    H/O tension headache 10/1999   family stressors   HSV (herpes simplex virus) anogenital infection hx   Hypertension    Hypertension    IBS (irritable bowel syndrome)    IC (interstitial cystitis) 11/2002   Dr. Janit   Kidney stone    Renal stone    Rotator cuff injury    partial tear left arm   Squamous cell carcinoma of leg, left    clear margins   STD (sexually transmitted disease) 11/30/99, 12/06   HSV, 12/06   TMJ (temporomandibular joint syndrome) 2004   Past Surgical History:  Procedure Laterality Date   AUGMENTATION MAMMAPLASTY  02/2006   reconstruction   BLEPHAROPLASTY Bilateral    02/2023   COLPOSCOPY  11/1999   no lesion   FOOT SURGERY  2012   right    KNEE ARTHROPLASTY  1998   left knee reconstruction   LITHOTRIPSY     07/2022 kidney stone   LITHOTRIPSY     ROTATOR CUFF REPAIR  2019   1/19 left arm, 11/19 right arm   SQUAMOUS CELL CARCINOMA EXCISION     left leg 07/2018   VAGINA SURGERY  1982   rectal/vaginal repair post  partum   Patient Active Problem List   Diagnosis Date Noted   Dyspareunia in female 10/21/2023   Incontinence of feces 10/21/2023   Pelvic organ prolapse quantification stage 2 rectocele 10/21/2023   Constipation 10/21/2023   Hypertension    Herpes simplex infection of genitourinary system 09/08/2018    PCP: Fischer, Damien HERO, NP  REFERRING PROVIDER: Guadlupe Lianne DASEN, MD  REFERRING DIAG: N94.10 (ICD-10-CM) - Dyspareunia in female R15.9 (ICD-10-CM) - Incontinence of feces, unspecified fecal incontinence type N81.6 (ICD-10-CM) - Pelvic organ prolapse quantification stage 2 rectocele  THERAPY DIAG:  Muscle weakness (generalized)  Unspecified lack of coordination  Abnormal posture  Rationale for Evaluation and Treatment: Rehabilitation  ONSET DATE: 1982  SUBJECTIVE:  SUBJECTIVE STATEMENT: Pt reports has been  improving to 4 bowel movements weekly now, emptying better. May need to go back 2-3x times to fully empty. Some times a little less but improving. Pt reports she is able to fully empty but not at first, also mostly formed but soft stools now.     PAIN:  Are you having pain? No   PRECAUTIONS: None  RED FLAGS: None   WEIGHT BEARING RESTRICTIONS: No  FALLS:  Has patient fallen in last 6 months? No  OCCUPATION: office manager  ACTIVITY LEVEL : exercising several times a week  PLOF: Independent  PATIENT GOALS: find a way to manage bowel movements and stop leaking - avoid more surgery  PERTINENT HISTORY:  Vaginal surgery (rectal/vaginal repair postpartum) 1982, augmentation mammaplasty 2008, depression, GERD, HSV, IBS, IC Sexual abuse: No  BOWEL MOVEMENT: Pain with bowel movement: No Type of bowel movement:Type (Bristol Stool Scale) 1x/week usually 3-4, can be 2, then also 6,  Frequency 3x/week, Strain yes, and Splinting yes Fully empty rectum: No Leakage: Yes: will leak if she cannot get to the bathroom fast enough; has leaked during intercourse Pads: Yes: 2 panty liners a day Fiber supplement/laxative stool softeners and fiber   URINATION: Pain with urination: No Fully empty bladder: Yes:   Stream: Strong, but then post-void dribble Urgency: No Frequency: every 3 hours, 2x/night Fluid Intake: 16oz and a couple mountain dews Leakage: none Pads: Yes: see above  INTERCOURSE:  Ability to have vaginal penetration Yes  Pain with intercourse: she was having issues when going through menopause, but is now much better and the only time it hurts is when she is constipated  DrynessNo Climax: WNL Marinoff Scale: 1/3 Lubricant: yes  PREGNANCY: Vaginal deliveries 1 Tearing Yes: large tear that lead to fistula  Episiotomy No C-section deliveries 0 Currently pregnant No  PROLAPSE: Pressure and Bulge   OBJECTIVE:  Note: Objective measures were completed at Evaluation unless otherwise noted.  10/26/23: PATIENT SURVEYS:   PFIQ-7: 76 01/26/24 PFIQ-7: 43  COGNITION: Overall cognitive status: Within functional limits for tasks assessed     SENSATION: Light touch: Appears intact   FUNCTIONAL TESTS:  Squat: valgus knee collapse  Single leg stance:  Rt: pelvic drop  Lt: pelvic drop Curl-up test: WNL   GAIT: Assistive device utilized: None Comments: WNL  POSTURE: rounded shoulders, forward head, decreased lumbar lordosis, and posterior pelvic tilt   LUMBARAROM/PROM:  A/PROM A/PROM  Eval (% available)  Flexion WNL  Extension 50  Right lateral flexion WNL  Left lateral flexion WNL  Right rotation WNL  Left rotation WNL   (Blank rows = not tested)  PALPATION:   General: WNL  Pelvic Alignment: posterior pelvic tilt  Abdominal: WNL                External Perineal Exam: perineal scar tissue                             Internal  Pelvic Floor: posterior vaginal wall scar tissue, perineal scar tissue restriction  Patient confirms identification and approves PT to assess internal pelvic floor and treatment Yes  PELVIC MMT:   MMT eval  Vaginal 2/5, 5 seconds, 4 repeats  Internal Anal Sphincter 2/5, 3 sec  External Anal Sphincter 2/5, 3 sec  Puborectalis 2/5, 3 sec  Diastasis Recti WNL  (Blank rows = not tested)        TONE: WNL  PROLAPSE: Grade  2 posterior vaginal wall laxity, grade 1 anterior vaginal wall laxity  TODAY'S TREATMENT:                                                                                                                              DATE:  01/26/24: Bear plank x15 5s holds with exhale Clamshell blue band 2x10 seated 3 way standing hip 2x10 each - attempted with modified plank but difficult to coordinate and modified to standing hip at counter Dead lifts 9# 2x10 - max cues for techniques with difficulty with hip hinge Squats 2x10 9#  Mario punches 3# 2x10 each side Reviewed importance of pressure management and coordination of pelvic floor activation with exhale with all exercises and (push/pull/lifting)  01/18/24 Exercises: Sidelying clam shells + ball press 2 x 10 bil Sidelying hip abduction + ball press 2 x 10 bil Therapeutic activities: Squats to table addressing form to decrease Lt knee pain 2 x 10 Deadlift + 5 lbs 2 x 10 Standing rows + blue band 2 x 10 Standing shoulder extensions + green band 2 x 10 Pallof press + green band 2 x 10   12/05/23 Manual: Supine bowel mobilization  Neuromuscular re-education: Transversus abdominus training with multimodal cues for improved motor control and breath coordination Supine hip adduction ball press with transversus abdominus and pelvic floor muscle contractions and breath coordination 10x Bridge with hip adduction, transversus abdominus, and pelvic floor muscle 2 x 10 Supine leg extensions 10x bil Bird dog 10x Bear plank  lifts 10x Seated hip adduction ball press with transversus abdominus and pelvic floor muscle 2 x 10 Seated hip abduction red band with transversus abdominus and pelvic floor muscle 2 x 10 Seated resisted march red band with transversus abdominus and pelvic floor muscle 2 x 10    PATIENT EDUCATION:  Education details: See above Person educated: Patient Education method: Programmer, Multimedia, Demonstration, Actor cues, Verbal cues, and Handouts Education comprehension: verbalized understanding  HOME EXERCISE PROGRAM: [id number]  ASSESSMENT:  CLINICAL IMPRESSION: Patient is a 58 y.o. female who was seen today for physical therapy treatment for fecal incontinence and inconsistency with bowel movements. Pt has seen excellent progress with bowel movement consistency and frequency since her first visit. Due to improved consistency of stool, she is not having any fecal incontinence, but she is losing gas outside of her control. We progressed into more standing positions to give her variety and increased ease of performance and create more availability to do in kitchen as pt repots she spends a lot of time here. This also allowed her to work against gravity to improve functional carry over of exercises. She will continue to benefit from skilled PT intervention in order to decrease fecal incontinence, improve consistency of bowel movements, decrease constipation/diarrhea, address impairments, and improve quality of life.   OBJECTIVE IMPAIRMENTS: decreased activity tolerance, decreased coordination, decreased endurance, decreased mobility, decreased ROM, decreased strength, increased fascial restrictions, increased muscle spasms, impaired flexibility, impaired tone, improper  body mechanics, postural dysfunction, and pain.   ACTIVITY LIMITATIONS: continence  PARTICIPATION LIMITATIONS: cleaning, interpersonal relationship, community activity, and occupation  PERSONAL FACTORS: 3+ comorbidities: medical history  are also affecting patient's functional outcome.   REHAB POTENTIAL: Good  CLINICAL DECISION MAKING: Evolving/moderate complexity  EVALUATION COMPLEXITY: Moderate   GOALS: Goals reviewed with patient? Yes  SHORT TERM GOALS: Target date: 11/23/2023   Pt will be independent with HEP in order to improve activity tolerance.   Baseline: Goal status: MET 01/18/24  2.  Patient will report 25% improvement in fecal incontinence in order to decrease pad use, decrease risk of infection, and improve quality of life.   Baseline: 3x/week Goal status: MET 01/18/24  3.  Pt will report consistency of bowel movements as 3-4 on Bristol Stool Scale in order to achieve better emptying.   Baseline: 2-6 Goal status:MET 01/18/24  4.  Pt will be independent with use of squatty potty, relaxed toileting mechanics, and improved bowel movement techniques in order to increase ease of bowel movements and complete evacuation.   Baseline: not using Goal status: MET 01/18/24  5.  Pt will be able to teach back and utilize urge suppression technique in order to help reduce fecal urgency and incontinence.    Baseline: frequent incontinence before bowel movements at least 3x/week Goal status: MET 01/18/24    LONG TERM GOALS: Target date: 04/11/2024  Pt will be independent with advanced HEP in order to improve activity tolerance.   Baseline:  Goal status: IN PROGRESS 01/18/24  2.  Patient will be able to perform single leg stance with good pelvic stability in order to demonstrate appropriate pelvic floor muscle and transversus abdominus strength and coordination in order to decrease fecal incontinence.   Baseline:  Goal status: IN PROGRESS 01/26/24  3.  Patient will report 75% improvement in fecal incontinence in order to decrease pad use, decrease risk of infection, and improve quality of life.   Baseline: leaks prior to each bowel movement at least 3x/week Goal status: MET 01/18/24  4.  Increased  frequency of bowel movements to at least 5x/week in order to decrease stool burden and decrease episodes of diarrhea to prevent fecal incontinence and infection.  Baseline: 3x/week Goal status: IN PROGRESS 01/26/24  5.  Pt will decrease PFIQ-7 score to less than 30 to demonstrate improved quality of life and impact of fecal incontinence.  Baseline: 76 Goal status: IN PROGRESS 01/26/24   PLAN:  PT FREQUENCY: 1-2x/week  PT DURATION: 10 visits   PLANNED INTERVENTIONS: 97164- PT Re-evaluation, 97110-Therapeutic exercises, 97530- Therapeutic activity, 97112- Neuromuscular re-education, 97535- Self Care, 02859- Manual therapy, [phone]- Gait training, [phone]- Aquatic Therapy, [phone]- Electrical stimulation (unattended), [phone]- Traction (mechanical), [id number]- Ionotophoresis 4mg /ml Dexamethasone, 79439 (1-2 muscles), 20561 (3+ muscles)- Dry Needling, Patient/Family education, Balance training, Taping, Joint mobilization, Joint manipulation, Spinal manipulation, Spinal mobilization, Scar mobilization, Vestibular training, Cryotherapy, Moist heat, and Biofeedback  PLAN FOR NEXT SESSION: core strengthening; significant HEP updates due to high out of pocket cost  Darryle Navy, PT, DPT 12/11/20257:20 PM  [facility] [address], [address] [place], [place] [address] Phone # [phone] Fax [phone]

## 2024-02-27 ENCOUNTER — Encounter: Payer: Self-pay | Admitting: *Deleted

## 2024-03-07 ENCOUNTER — Ambulatory Visit

## 2024-03-20 ENCOUNTER — Ambulatory Visit: Admitting: Radiology

## 2024-03-22 NOTE — Progress Notes (Unsigned)
  Urogynecology Return Visit  SUBJECTIVE  History of Present Illness: Gabriela Figueroa is a 59 y.o. female seen in follow-up for vaginal bleeding***, stage II pelvic organ prolapse, constipation, fecal incontinence, and dyspareunia. Plan at last visit was continue pelvic floor PT, vaginal estrogen, and titration of fiber supplementaiton.   Vaginal bleeding*** ***pessary Using vaginal estrogen with Imvexxy *** oral estradiol -progesterone  for HRT, evening primrose oil   Underwent pelvic floor PT x 3 with some relief Denies FI in the past 3 months***. Using benefiber daily, eats fried egg when she has bloating BM 3-4 days/week Denies straining Reports splinting when she has incomplete emptying sensation a few months last month Using squatty potty and relaxes when she sits on the toilet Denies pain with intercourse Vaginal bulge inside vagina, denies passage beyond vaginal opening and feels it when she pushes her finger into the vagina. history of rectovaginal fistula repair in 1982 from breakdown of 4th degree laceration repair   Past Medical History: Patient  has a past medical history of Abnormal pap (10/1999), Depression, Dyspareunia (04/2002), Echocardiogram abnormal (about 1998), GERD (gastroesophageal reflux disease) (11/2002), Glaucoma, H/O tension headache (10/1999), HSV (herpes simplex virus) anogenital infection (hx), Hypertension, Hypertension, IBS (irritable bowel syndrome), IC (interstitial cystitis) (11/2002), Kidney stone, Renal stone, Rotator cuff injury, Squamous cell carcinoma of leg, left, STD (sexually transmitted disease) (11/30/99, 12/06), and TMJ (temporomandibular joint syndrome) (2004).   Past Surgical History: She  has a past surgical history that includes Vagina surgery (1982); Knee Arthroplasty (1998); Colposcopy (11/1999); Augmentation mammaplasty (02/2006); Foot surgery (2012); Rotator cuff repair (2019); Squamous cell carcinoma excision; Lithotripsy;  Blepharoplasty (Bilateral); and Lithotripsy.   Medications: She has a current medication list which includes the following prescription(s): vitamin d3, clonazepam, creatine, cyclobenzaprine, imvexxy  maintenance pack, bijuva , evening primrose oil, fluticasone, hyoscyamine, latanoprost, loratadine, naproxen, fish oil, omeprazole, probiotic product, timolol, trazodone, and valacyclovir .   Allergies: Patient has no known allergies.   Social History: Patient  reports that she has never smoked. She has never been exposed to tobacco smoke. She has never used smokeless tobacco. She reports current alcohol use of about 4.0 standard drinks of alcohol per week. She reports that she does not use drugs.     OBJECTIVE     Physical Exam: There were no vitals filed for this visit.  Gen: No apparent distress, A&O x 3.  Detailed Urogynecologic Evaluation:  Deferred.      ASSESSMENT AND PLAN    Gabriela Figueroa is a 59 y.o. with:  No diagnosis found.   There are no diagnoses linked to this encounter. RTC in 3 months for pelvic floor PT follow-up, possible pessary fitting.   Gabriela ONEIDA Gillis, MD

## 2024-03-23 ENCOUNTER — Encounter: Payer: Self-pay | Admitting: Obstetrics

## 2024-03-23 ENCOUNTER — Other Ambulatory Visit: Payer: Self-pay | Admitting: *Deleted

## 2024-03-23 ENCOUNTER — Ambulatory Visit: Admitting: Obstetrics

## 2024-03-23 VITALS — BP 124/74 | HR 74

## 2024-03-23 DIAGNOSIS — N950 Postmenopausal bleeding: Secondary | ICD-10-CM | POA: Insufficient documentation

## 2024-03-23 DIAGNOSIS — K5909 Other constipation: Secondary | ICD-10-CM

## 2024-03-23 NOTE — Assessment & Plan Note (Signed)
-   For constipation, we reviewed the importance of a better bowel regimen.  We also discussed the importance of avoiding chronic straining, as it can exacerbate her pelvic floor symptoms; we discussed treating constipation and straining prior to surgery, as postoperative straining can lead to damage to the repair and recurrence of symptoms. We discussed initiating therapy with increasing fluid intake, fiber supplementation, stool softeners, and laxatives such as miralax.  - encouraged to continue squatting position for defecation  - stopped Benefier due to constipation, Trulance with loose stool - encouraged to continue titration of miralax and metamucil to optimize stool consistency due to bloating and gas - encouraged follow-up with GI regarding treatment of constipation

## 2024-03-23 NOTE — Assessment & Plan Note (Signed)
-   Discussed evaluation is indicated to rule out precancerous or cancerous lesion and options include pelvic ultrasound or endometrial biopsy.  Risks of endometrial biopsy include bleeding, pain/cramping, infection and rare risk of uterine perforation and if that occurs possible laparoscopy/surgery.   - pt desired to proceed with Endometrial biopsy today. Pending pathology - encouraged follow-up with GYN if persistent symptoms that will require additional workup such as hysteroscopy D&C or imaging.

## 2024-03-23 NOTE — Patient Instructions (Addendum)
 Taking Care of Yourself after Endometrial biopsy   Drink plenty of water for a day or two following your procedure. Try to have about 8 ounces (one cup) at a time, and do this 6 times or more per day unless you have fluid restrictitons AVOID irritative beverages such as coffee, tea, soda, alcoholic or citrus drinks for a day or two  For the first 1-2 days after the procedure, your vaginal discharge may be pink or red in color. You may have some blood in your urine as a normal side effect of the procedure. Large amounts of bleeding or difficulty urinating are NOT normal. Call the nurse line if this happens or go to the nearest Emergency Room if the bleeding is heavy or you cannot urinate at all and it is after hours.   You may experience some discomfort after having this procedure.  For pain control: - Ibuprofen 600mg  and tylenol 500mg  every 6 hours with food as needed for pain.  - heating pad as needed for muscle pain.   You may return to normal daily activities such as work, school, driving, exercising and housework on the day of the procedure. If your doctor gave you a prescription, take it as ordered.   Postmenopausal bleeding:   Discussed evaluation is indicated to rule out precancerous or cancerous lesion and options include pelvic ultrasound or endometrial biopsy.  Risks of endometrial biopsy include bleeding, pain/cramping, infection and rare risk of uterine perforation and if that occurs possible laparoscopy/surgery.    Please follow-up with your gyn if you continue to experience vaginal bleeding.

## 2024-04-10 ENCOUNTER — Ambulatory Visit

## 2024-05-04 ENCOUNTER — Ambulatory Visit: Admitting: Obstetrics

## 2024-05-22 ENCOUNTER — Ambulatory Visit: Admitting: Obstetrics
